# Patient Record
Sex: Female | Born: 1935 | Race: White | Hispanic: No | Smoking: Former smoker
Health system: Southern US, Community
[De-identification: ages and names within clinical notes are randomized; demographics above are authoritative.]

## PROBLEM LIST (undated history)

## (undated) DIAGNOSIS — R011 Cardiac murmur, unspecified: Secondary | ICD-10-CM

## (undated) DIAGNOSIS — E119 Type 2 diabetes mellitus without complications: Secondary | ICD-10-CM

## (undated) DIAGNOSIS — I10 Essential (primary) hypertension: Secondary | ICD-10-CM

## (undated) DIAGNOSIS — E785 Hyperlipidemia, unspecified: Secondary | ICD-10-CM

---

## 1998-12-05 LAB — HM PAP SMEAR: HM Pap smear: NORMAL

## 2000-09-17 HISTORY — PX: ABDOMINAL HYSTERECTOMY: SHX81

## 2003-09-18 HISTORY — PX: HERNIA REPAIR: SHX51

## 2004-11-03 ENCOUNTER — Ambulatory Visit: Payer: Self-pay | Admitting: Family Medicine

## 2005-11-26 ENCOUNTER — Ambulatory Visit: Payer: Self-pay | Admitting: Unknown Physician Specialty

## 2005-11-26 LAB — HM COLONOSCOPY

## 2006-04-04 ENCOUNTER — Ambulatory Visit: Payer: Self-pay | Admitting: Family Medicine

## 2007-03-28 ENCOUNTER — Ambulatory Visit: Payer: Self-pay | Admitting: Family Medicine

## 2007-04-17 ENCOUNTER — Ambulatory Visit: Payer: Self-pay | Admitting: Family Medicine

## 2008-01-07 LAB — HM DEXA SCAN

## 2008-04-22 ENCOUNTER — Ambulatory Visit: Payer: Self-pay | Admitting: Family Medicine

## 2009-04-27 ENCOUNTER — Ambulatory Visit: Payer: Self-pay | Admitting: Family Medicine

## 2009-11-07 ENCOUNTER — Ambulatory Visit: Payer: Self-pay | Admitting: Family Medicine

## 2010-02-09 ENCOUNTER — Emergency Department: Payer: Self-pay | Admitting: Emergency Medicine

## 2010-05-19 ENCOUNTER — Encounter: Payer: Self-pay | Admitting: Family Medicine

## 2010-05-24 ENCOUNTER — Ambulatory Visit: Payer: Self-pay | Admitting: Family Medicine

## 2010-07-06 ENCOUNTER — Ambulatory Visit: Payer: Self-pay | Admitting: Family Medicine

## 2011-01-01 ENCOUNTER — Observation Stay: Payer: Self-pay | Admitting: Internal Medicine

## 2011-09-17 ENCOUNTER — Ambulatory Visit: Payer: Self-pay | Admitting: Family Medicine

## 2011-11-13 ENCOUNTER — Encounter: Payer: Self-pay | Admitting: Family Medicine

## 2011-11-16 ENCOUNTER — Encounter: Payer: Self-pay | Admitting: Family Medicine

## 2011-12-17 ENCOUNTER — Encounter: Payer: Self-pay | Admitting: Family Medicine

## 2012-01-16 ENCOUNTER — Encounter: Payer: Self-pay | Admitting: Family Medicine

## 2012-05-13 ENCOUNTER — Inpatient Hospital Stay: Payer: Self-pay | Admitting: Internal Medicine

## 2012-05-13 LAB — URINALYSIS, COMPLETE
Bilirubin,UR: NEGATIVE
Blood: NEGATIVE
Ketone: NEGATIVE
Ph: 6 (ref 4.5–8.0)
Protein: NEGATIVE
Specific Gravity: 1.011 (ref 1.003–1.030)
Squamous Epithelial: <1

## 2012-05-13 LAB — CBC
HGB: 11.2 g/dL — ABNORMAL LOW (ref 12.0–16.0)
MCH: 31.7 pg (ref 26.0–34.0)
MCHC: 33.1 g/dL (ref 32.0–36.0)
RDW: 13.4 % (ref 11.5–14.5)
WBC: 7.1 10*3/uL (ref 3.6–11.0)

## 2012-05-13 LAB — BASIC METABOLIC PANEL
Anion Gap: 3 — ABNORMAL LOW (ref 7–16)
BUN: 30 mg/dL — ABNORMAL HIGH (ref 7–18)
Creatinine: 1.05 mg/dL (ref 0.60–1.30)
EGFR (African American): 59 — ABNORMAL LOW
EGFR (Non-African Amer.): 51 — ABNORMAL LOW
Glucose: 117 mg/dL — ABNORMAL HIGH (ref 65–99)
Sodium: 140 mmol/L (ref 136–145)

## 2012-05-14 LAB — LIPID PANEL
Cholesterol: 150 mg/dL (ref 0–200)
Ldl Cholesterol, Calc: 72 mg/dL (ref 0–100)
Triglycerides: 147 mg/dL (ref 0–200)
VLDL Cholesterol, Calc: 29 mg/dL (ref 5–40)

## 2012-12-18 ENCOUNTER — Ambulatory Visit: Payer: Self-pay | Admitting: Family Medicine

## 2013-01-19 ENCOUNTER — Ambulatory Visit: Payer: Self-pay | Admitting: Family Medicine

## 2013-01-22 ENCOUNTER — Ambulatory Visit: Payer: Self-pay | Admitting: Family Medicine

## 2014-01-08 ENCOUNTER — Ambulatory Visit: Payer: Self-pay | Admitting: Family Medicine

## 2014-01-09 LAB — HM MAMMOGRAPHY: HM Mammogram: NORMAL

## 2014-10-24 ENCOUNTER — Emergency Department: Payer: Self-pay | Admitting: Emergency Medicine

## 2014-10-28 ENCOUNTER — Ambulatory Visit: Payer: Self-pay | Admitting: Family Medicine

## 2014-11-24 LAB — LIPID PANEL
Cholesterol: 196 mg/dL (ref 0–200)
HDL: 71 mg/dL — AB (ref 35–70)
LDL Cholesterol: 90 mg/dL
LDl/HDL Ratio: 1.3
TRIGLYCERIDES: 176 mg/dL — AB (ref 40–160)

## 2014-11-24 LAB — BASIC METABOLIC PANEL
BUN: 25 mg/dL — AB (ref 4–21)
CREATININE: 1.4 mg/dL — AB (ref <=1.1)
Glucose: 186 mg/dL
Potassium: 5.2 mmol/L (ref 3.4–5.3)
SODIUM: 139 mmol/L (ref 137–147)

## 2014-11-24 LAB — HEPATIC FUNCTION PANEL
ALK PHOS: 83 U/L (ref 25–125)
ALT: 7 U/L (ref 7–35)
AST: 16 U/L (ref 13–35)
Bilirubin, Total: 0.3 mg/dL

## 2014-11-24 LAB — CBC AND DIFFERENTIAL
HCT: 35 % — AB (ref 36–46)
Hemoglobin: 11.5 g/dL — AB (ref 12.0–16.0)
NEUTROS ABS: 76 /uL
Platelets: 268 10*3/uL (ref 150–399)
WBC: 7.5 10^3/mL

## 2014-11-24 LAB — HEMOGLOBIN A1C: HEMOGLOBIN A1C: 8 % — AB (ref 4.0–6.0)

## 2014-11-24 LAB — TSH: TSH: 2.02 u[IU]/mL (ref <=5.90)

## 2015-01-04 NOTE — Discharge Summary (Signed)
PATIENT NAME:  [name], Linda H MR#:  [id number] DATE OF BIRTH:  [birth year]  DATE OF ADMISSION:  05/13/2012 DATE OF DISCHARGE:  05/14/2012  PRIMARY CARE PHYSICIAN:  Dr. Julieanne Mansonichard Gilbert     CHIEF COMPLAINT: Right lip and tongue swelling along with right facial numbness.   DISCHARGE DIAGNOSES:  1. Transient ischemic attack.  2. Hypertension.  3. History of cerebrovascular accident.  4. Type 2 diabetes.  5. Mild internal carotid artery stenosis of 50% bilaterally.  CONDITION ON DISCHARGE: Fair. Vitals stable.   REFERRALS: Home Health physical therapy   MEDICATIONS:  1. Aspirin 81 mg daily.  2. Plavix 75 mg daily.  3. Calcium with vitamin D 1 tablet daily.  4. Glipizide XL 10 mg extended-release once a day.  5. Simvastatin 80 mg daily.  6. Multivitamin p.o. daily.  7. Hydrochlorothiazide/quinapril 25/20, 1 tablet daily.  8. Metformin 1000 mg b.i.d.  9. Trazodone 100 mg at bedtime.  10. Ferrous sulfate 325 mg p.o. daily.   DIET: Low sodium, ADA 1800-calorie diet.   FOLLOWUP: Follow up with Dr. Sullivan LoneGilbert on 05/21/2012 at 9:30 a.m.   LABORATORY DATA: Cholesterol is 150. Triglycerides 147, LDL 72.  Ultrasound carotid Doppler suggestive of borderline stenosis of 50% in the internal carotid arteries bilaterally. Echo Doppler: Ejection fraction is 50%,. Left atrium is mildly dilated. There is mild to moderate mitral regurgitation. There is mild tricuspid regurgitation. Mild valvular aortic stenosis. No apparent source? of cerebrovascular accident. Urinalysis negative for urinary tract infection. Glucose 117, BUN 30, creatinine 1.05, sodium 140, potassium 4.6, chloride 104, bicarbonate 33. Hemoglobin and hematocrit 11.2 and 33.8. White count 7.1, platelet count 297. CT of the head without contrast shows mild atrophy.   BRIEF SUMMARY OF HOSPITAL COURSE: Linda Jones is a very pleasant 81 year old Caucasian female with history of cerebrovascular accident, type 2 diabetes, and hypertension in the  past who came in with:  1. Numbness on the right side of the face along with mild expressive aphasia, tingling of the lips and tongue, which improved over the ER stay.  CT of the head was negative for acute infarction. She was continued on aspirin and Plavix. Carotid ultrasound showed bilateral 50% mild stenosis. She will see vascular as an outpatient. We will defer to Dr. Sullivan LoneGilbert to make those arrangements.   2. Physical therapy saw the patient and recommended Home Health PT which has been arranged.  3. Hypertension. Continue home medication which is hydrochlorothiazide/quinapril.  4. Type 2 diabetes. Sliding scale insulin and metformin b.i.d. along with glipizide.  5. Chronic anemia. Iron supplements were continued.  6. Hyperlipidemia. The patient is on simvastatin. Lipid profile within normal limits.  7. Hospital stay otherwise remained stable.  8. CODE STATUS: The patient remained a FULL CODE.   TIME SPENT: 40 minutes.  ____________________________ Wylie HailSona A. Allena KatzPatel, MD sap:bjt D: 05/15/2012 07:05:41 ET T: 05/15/2012 13:19:43 ET JOB#: [id number]  cc: Nikyah Lackman A. Allena KatzPatel, MD, <Dictator> Richard L. Sullivan LoneGilbert, MD Willow OraSONA A Hortencia Martire MD ELECTRONICALLY SIGNED 05/20/2012 15:44

## 2015-01-04 NOTE — H&P (Signed)
PATIENT NAME:  Linda Jones, Linda Jones MR#:  [id number] DATE OF BIRTH:  28-Feb-1932  DATE OF ADMISSION:  05/13/2012  PRIMARY CARE PHYSICIAN: Richard L. Sullivan Lone, MD   CHIEF COMPLAINT: Numbness over the lips, tongue and right side of the face.   HISTORY OF PRESENT ILLNESS: Linda Jones is a pleasant 81 year old Caucasian female with history of stroke about two years ago, chronic anemia, hypercholesterolemia, diabetes and hypertension, comes to the Emergency Room after around 9:00 she started noticing numbness and tingling and "funny feeling" on her tongue and lips. It increased, and she started having numbness around her right face and minimal weakness in her right upper extremity. Given her history of stroke in the past, she has decided to come to the Emergency Room. She is hemodynamically stable. The patient did have some slurred speech with expressive aphasia which has cleared. Her symptoms, however, have improved since she came to the Emergency Room. The patient's CT of the head remains negative for any acute cerebrovascular accident. She is being admitted for further evaluation and management.   PAST MEDICAL/SURGICAL HISTORY:  1. Cerebrovascular accident two years ago without any neurologic deficit.  2. Chronic anemia.  3. Hypercholesterolemia.  4. Hypertension.  5. Type 2 diabetes.  6. Total hysterectomy.  7. Hernia repair in the past.   MEDICATIONS:  1. Trazodone 100 mg at bedtime.  2. Simvastatin 80 mg daily.  3. Plavix 75 mg daily.  4. Multivitamin p.o. daily.  5. Metformin 1000 mg b.i.d.  6. Hydrochlorothiazide/quinapril 25/20, 1 tablet daily.  7. Glipizide XL 10 mg daily.  8. Ferrous sulfate 325 mg p.o. daily.  9. Calcium with vitamin D, 1 tablet p.o. daily.  10. Aspirin 81 mg daily.   FAMILY HISTORY: Positive for hypertension and type 2 diabetes.   REVIEW OF SYSTEMS: CONSTITUTIONAL: No fever or fatigue, weakness. EYES: No blurred or double vision or cataract. ENT: No tinnitus, ear  pain, hearing loss. RESPIRATORY: No cough, wheeze, hemoptysis, or chronic obstructive pulmonary disease. CARDIOVASCULAR: No chest pain, orthopnea, or edema. GASTROINTESTINAL: No nausea, vomiting, diarrhea, or abdominal pain. GU: No dysuria or hematuria. ENDOCRINE: No polyuria, nocturia, or thyroid problems. HEMATOLOGY: No anemia or easy bruising. SKIN: No acne or rash. MUSCULOSKELETAL: Positive for arthritis. NEUROLOGIC: Positive for some numbness on her right side of the face, and she earlier did have some expressive aphasia. PSYCHIATRIC: No anxiety or depression. All other systems are reviewed and negative.   PHYSICAL EXAMINATION:  GENERAL: The patient is awake, alert, oriented x3, not in acute distress.   VITAL SIGNS: She is afebrile. Pulse is 79, blood pressure is 118/65, saturations are 98% on 3 liters.   HEENT: Atraumatic, normocephalic. Pupils are equal, round, and reactive to light and accommodation. Extraocular movements are intact. Oral mucosa is moist.   NECK: Supple. No JVD. No carotid bruit.   LUNGS: Clear to auscultation bilaterally. No rales, rhonchi, respiratory distress, or labored breathing.   HEART: Both the heart sounds are normal. Rate, rhythm is regular. PMI is not lateralized. Chest is nontender.   EXTREMITIES: Good pedal pulses, good femoral pulses. No lower extremity edema.   ABDOMEN: Soft, benign, and nontender. No organomegaly. Positive bowel sounds.   NEUROLOGIC: The patient is right-handed. Speech is clear. There is no facial droop. Cranial nerves II through XII are grossly intact. Her motor system exam in both upper and lower extremities is 4+/5 strength. Deep tendon jerks are 1+, and plantars are downgoing. Gait was not tested.   LABORATORY, DIAGNOSTIC AND RADIOLOGICAL DATA:  EKG: Normal sinus rhythm. Ultrasound carotid Doppler findings are borderline for stenosis of 50% in the internal carotid arteries bilaterally. Urinalysis is negative for urinary tract  infection. Glucose is 117, BUN is 30, creatinine is 1.05, chloride is 104, bicarbonate is 33, calcium is 9.9. Hemoglobin and hematocrit  is 11.2 and 33.8. White count is 7.1. Troponin is 0.02. CT of the head: Mild atrophy, chronic small vessel ischemic disease. No acute intracranial abnormality.   ASSESSMENT: Linda Jones is a 81 year old with history of type 2 diabetes, hypertension, history of stroke in the past, comes in with:   1. Numbness on the right side of the face along with mild expressive aphasia, tingling numbness of the tongue and the lips which have improved over the ER stay consistent with transient ischemic attack: CT of the head is negative for acute infarct. We will admit the patient to the telemetry floor. A 2 gram, 1800 calorie diet. We will continue to monitor neurologically every 4 hours. We will continue aspirin and Plavix as before. MRI will be done only if symptoms worsen. Carotid Doppler ultrasound results are noted. We will refer the patient for outpatient vascular evaluation. We will consult Physical Therapy as well.  2. Hypertension: Continue home medications which are hydrochlorothiazide/quinapril.  3. Type 2 diabetes: Sliding scale insulin and metformin b.i.d. along with glipizide.  4. Chronic anemia: Continue iron supplements.  5. Hyperlipidemia: On simvastatin.  6. Deep vein thrombosis prophylaxis: The  patient is ambulatory, and TEDS.   The hospital admission plan was discussed with the patient and the patient's son, and further work-up will be according to the patient's clinical course.   TIME SPENT: 50 minutes. ____________________________ Wylie HailSona A. Allena KatzPatel, MD sap:cbb D: 05/13/2012 16:57:36 ET T: 05/13/2012 18:32:36 ET JOB#: [id number]  cc: Martie Muhlbauer A. Allena KatzPatel, MD, <Dictator> Richard L. Sullivan LoneGilbert, MD Willow OraSONA A Lynnie Koehler MD ELECTRONICALLY SIGNED 05/20/2012 15:44

## 2015-02-07 DIAGNOSIS — IMO0002 Reserved for concepts with insufficient information to code with codable children: Secondary | ICD-10-CM | POA: Insufficient documentation

## 2015-02-07 DIAGNOSIS — D649 Anemia, unspecified: Secondary | ICD-10-CM | POA: Insufficient documentation

## 2015-02-07 DIAGNOSIS — I6350 Cerebral infarction due to unspecified occlusion or stenosis of unspecified cerebral artery: Secondary | ICD-10-CM | POA: Insufficient documentation

## 2015-02-07 DIAGNOSIS — E538 Deficiency of other specified B group vitamins: Secondary | ICD-10-CM | POA: Insufficient documentation

## 2015-02-07 DIAGNOSIS — I6529 Occlusion and stenosis of unspecified carotid artery: Secondary | ICD-10-CM | POA: Insufficient documentation

## 2015-02-07 DIAGNOSIS — E782 Mixed hyperlipidemia: Secondary | ICD-10-CM | POA: Insufficient documentation

## 2015-02-07 DIAGNOSIS — E119 Type 2 diabetes mellitus without complications: Secondary | ICD-10-CM | POA: Insufficient documentation

## 2015-02-07 DIAGNOSIS — G939 Disorder of brain, unspecified: Secondary | ICD-10-CM | POA: Insufficient documentation

## 2015-02-07 DIAGNOSIS — I6782 Cerebral ischemia: Secondary | ICD-10-CM | POA: Insufficient documentation

## 2015-02-07 DIAGNOSIS — G2581 Restless legs syndrome: Secondary | ICD-10-CM | POA: Insufficient documentation

## 2015-02-07 DIAGNOSIS — R269 Unspecified abnormalities of gait and mobility: Secondary | ICD-10-CM | POA: Insufficient documentation

## 2015-02-07 DIAGNOSIS — I10 Essential (primary) hypertension: Secondary | ICD-10-CM | POA: Insufficient documentation

## 2015-03-23 ENCOUNTER — Other Ambulatory Visit: Payer: Self-pay | Admitting: Family Medicine

## 2015-04-08 ENCOUNTER — Other Ambulatory Visit: Payer: Self-pay

## 2015-04-08 MED ORDER — GLIPIZIDE 5 MG PO TABS
5.0000 mg | ORAL_TABLET | Freq: Every day | ORAL | Status: DC
Start: 2015-04-08 — End: 2015-09-27

## 2015-04-08 MED ORDER — SITAGLIPTIN PHOSPHATE 100 MG PO TABS: 100.0000 mg | ORAL_TABLET | Freq: Every day | ORAL | Status: DC

## 2015-04-08 NOTE — Telephone Encounter (Signed)
Please review, email from patient to be sent to [facility] pulled down already. Thank you-aa

## 2015-04-10 MED ORDER — TRAMADOL HCL 50 MG PO TABS: ORAL_TABLET | ORAL | Status: DC

## 2015-04-19 ENCOUNTER — Ambulatory Visit (INDEPENDENT_AMBULATORY_CARE_PROVIDER_SITE_OTHER): Payer: Medicare Other | Admitting: Family Medicine

## 2015-04-19 ENCOUNTER — Encounter: Payer: Self-pay | Admitting: Family Medicine

## 2015-04-19 VITALS — BP 128/60 | HR 68 | Temp 97.5°F | Resp 16 | Wt 173.0 lb

## 2015-04-19 DIAGNOSIS — E1140 Type 2 diabetes mellitus with diabetic neuropathy, unspecified: Secondary | ICD-10-CM

## 2015-04-19 DIAGNOSIS — I10 Essential (primary) hypertension: Secondary | ICD-10-CM | POA: Diagnosis not present

## 2015-04-19 DIAGNOSIS — I499 Cardiac arrhythmia, unspecified: Secondary | ICD-10-CM

## 2015-04-19 DIAGNOSIS — E668 Other obesity: Secondary | ICD-10-CM | POA: Diagnosis not present

## 2015-04-19 DIAGNOSIS — M79602 Pain in left arm: Secondary | ICD-10-CM | POA: Diagnosis not present

## 2015-04-19 DIAGNOSIS — R011 Cardiac murmur, unspecified: Secondary | ICD-10-CM | POA: Diagnosis not present

## 2015-04-19 DIAGNOSIS — IMO0002 Reserved for concepts with insufficient information to code with codable children: Secondary | ICD-10-CM

## 2015-04-19 LAB — POCT GLYCOSYLATED HEMOGLOBIN (HGB A1C): HEMOGLOBIN A1C: 7.3

## 2015-04-19 NOTE — Progress Notes (Signed)
Patient ID: Linda Jones, female   DOB: 1932/04/28, 81 y.o.   MRN: [id number]    Subjective:  HPI  Diabetes Mellitus Type II, Follow-up:   Lab Results  Component Value Date   HGBA1C 8.0* 11/24/2014   Last seen for diabetes 5 months ago.  Management changes included none. She reports good compliance with treatment. She is not having side effects.  Current symptoms include none. Home blood sugar records: 120-150  Episodes of hypoglycemia? no   Current Insulin Regimen: n/a Most Recent Eye Exam: almost a year ago  ------------------------------------------------------------------------   Hypertension, follow-up:  BP Readings from Last 3 Encounters:  04/19/15 128/60  11/24/14 142/76    She was last seen for hypertension 5 months ago.  BP at that visit was 142/76. Management changes since that visit include none .She reports good compliance with treatment. She is not having side effects.  She is not exercising. Outside blood pressures are not being checked. She is experiencing none.   ------------------------------------------------------------------------    Lipid/Cholesterol, Follow-up:   Last seen for this 5 months ago.  Management changes since that visit include none.  Last Lipid Panel:    Component Value Date/Time   CHOL 196 11/24/2014   CHOL 150 05/14/2012 0419   TRIG 176* 11/24/2014   TRIG 147 05/14/2012 0419   HDL 71* 11/24/2014   HDL 49 05/14/2012 0419   VLDL 29 05/14/2012 0419   LDLCALC 90 11/24/2014   LDLCALC 72 05/14/2012 0419    She reports good compliance with treatment. She is not having side effects.   Wt Readings from Last 3 Encounters:  04/19/15 173 lb (78.472 kg)  11/24/14 181 lb (82.101 kg)    ------------------------------------------------------------------------     Prior to Admission medications   Medication Sig Start Date End Date Taking? Authorizing Provider  aspirin 81 MG tablet Take by mouth.   Yes Historical  Provider, MD  calcium carbonate (OS-CAL) 600 MG TABS tablet Take by mouth.   Yes Historical Provider, MD  clopidogrel (PLAVIX) 75 MG tablet Take by mouth. 12/21/14  Yes Historical Provider, MD  cyanocobalamin (,VITAMIN B-12,) 1000 MCG/ML injection CYANOCOBALAMIN, 1000MCG/ML (Injection Solution)  1 Solution monthly for 0 days  Quantity: 10;  Refills: 0   Ordered :13-Sep-2014  Julieanne Manson MD;  Started 13-Sep-2014 Active Comments: # 12-1cc syringe with 25g 1 1/2" needles 09/13/14  Yes Historical Provider, MD  ferrous sulfate 325 (65 FE) MG tablet Take by mouth.   Yes Historical Provider, MD  glipiZIDE (GLUCOTROL) 5 MG tablet Take 1 tablet (5 mg total) by mouth daily. 04/08/15  Yes Richard Hulen Shouts., MD  glucose blood test strip TRUETEST TEST (In Vitro Strip)  1 Strip two times daily and as needed for 0 days  Quantity: 100;  Refills: 12   Ordered :04-Jun-2012  Janey Greaser ;  Started 04-Jun-2012 Active Comments: Medication taken as needed. 06/04/12  Yes Historical Provider, MD  hydrochlorothiazide (HYDRODIURIL) 25 MG tablet Take by mouth. 07/29/14  Yes Historical Provider, MD  hydrocortisone (ANUSOL-HC) 25 MG suppository Place rectally. 12/08/13  Yes Historical Provider, MD  lisinopril (PRINIVIL,ZESTRIL) 20 MG tablet Take by mouth. 12/21/14  Yes Historical Provider, MD  MULTIPLE VITAMIN PO Take by mouth.   Yes Historical Provider, MD  simvastatin (ZOCOR) 80 MG tablet TAKE 1/2 TABLET BY MOUTH DAILY( LABS NEEDED ON THE NEXT OFFICE VISIT) 03/23/15  Yes Maple Hudson., MD  sitaGLIPtin (JANUVIA) 100 MG tablet Take 1 tablet (100 mg total) by mouth daily.  04/08/15  Yes Richard Hulen Shouts., MD  traMADol Janean Sark) 50 MG tablet 1 to 2 tablets at bedtime 04/10/15  Yes Richard Hulen Shouts., MD  traZODone (DESYREL) 100 MG tablet Take by mouth. 12/31/14  Yes Historical Provider, MD    Patient Active Problem List   Diagnosis Date Noted  . Absolute anemia 02/07/2015  . Carotid artery narrowing 02/07/2015  .  Cerebral artery occlusion with cerebral infarction 02/07/2015  . Abnormality of gait 02/07/2015  . BP (high blood pressure) 02/07/2015  . Combined fat and carbohydrate induced hyperlipemia 02/07/2015  . Adult BMI 30+ 02/07/2015  . Restless leg 02/07/2015  . Temporary cerebral vascular dysfunction 02/07/2015  . Diabetes mellitus, type 2 02/07/2015  . B12 deficiency 02/07/2015    History reviewed. No pertinent past medical history.  History   Social History  . Marital Status: Widowed    Spouse Name: N/A  . Number of Children: N/A  . Years of Education: N/A   Occupational History  . Not on file.   Social History Main Topics  . Smoking status: Former Smoker -- 2 years  . Smokeless tobacco: Not on file  . Alcohol Use: No  . Drug Use: No  . Sexual Activity: Not on file   Other Topics Concern  . Not on file   Social History Narrative    Allergies  Allergen Reactions  . Erythromycin     Review of Systems  Constitutional: Negative.   HENT: Negative.   Eyes: Negative.   Respiratory: Negative.   Cardiovascular: Negative.   Gastrointestinal: Negative.   Genitourinary:       Incontinence.  Musculoskeletal: Positive for myalgias.  Skin: Negative.   Neurological: Positive for tremors (once in a while in her hands).  Endo/Heme/Allergies: Negative.   Psychiatric/Behavioral: Negative.     Immunization History  Administered Date(s) Administered  . Pneumococcal Conjugate-13 04/08/2014  . Pneumococcal Polysaccharide-23 08/01/2001, 05/26/2010  . Tdap 06/26/2011  . Zoster 07/10/2008   Objective:  BP 128/60 mmHg  Pulse 68  Temp(Src) 97.5 F (36.4 C) (Oral)  Resp 16  Wt 173 lb (78.472 kg)  Physical Exam  Constitutional: She is oriented to person, place, and time and well-developed, well-nourished, and in no distress.  HENT:  Head: Normocephalic and atraumatic.  Right Ear: External ear normal.  Left Ear: External ear normal.  Nose: Nose normal.  Eyes:  Conjunctivae and EOM are normal. Pupils are equal, round, and reactive to light.  Neck: Normal range of motion. Neck supple.  Cardiovascular: Normal rate, regular rhythm, normal heart sounds and intact distal pulses.   Irregular rhythm and systolic murmur at the right upper sternal border.  Pulmonary/Chest: Effort normal and breath sounds normal.  Musculoskeletal: Normal range of motion.  Post. Mid upper arm tender.  Neurological: She is alert and oriented to person, place, and time. She has normal reflexes. Gait normal. GCS score is 15.  Skin: Skin is warm and dry.  Psychiatric: Mood, memory, affect and judgment normal.    Lab Results  Component Value Date   WBC 7.5 11/24/2014   HGB 11.5* 11/24/2014   HCT 35* 11/24/2014   PLT 268 11/24/2014   GLUCOSE 117* 05/13/2012   CHOL 196 11/24/2014   TRIG 176* 11/24/2014   HDL 71* 11/24/2014   LDLCALC 90 11/24/2014   TSH 2.02 11/24/2014   HGBA1C 8.0* 11/24/2014    CMP     Component Value Date/Time   NA 139 11/24/2014   NA 140 05/13/2012 1105  K 5.2 11/24/2014   K 4.6 05/13/2012 1105   CL 104 05/13/2012 1105   CO2 33* 05/13/2012 1105   GLUCOSE 117* 05/13/2012 1105   BUN 25* 11/24/2014   BUN 30* 05/13/2012 1105   CREATININE 1.4* 11/24/2014   CREATININE 1.05 05/13/2012 1105   CALCIUM 9.9 05/13/2012 1105   AST 16 11/24/2014   ALT 7 11/24/2014   ALKPHOS 83 11/24/2014   GFRNONAA 51* 05/13/2012 1105   GFRAA 59* 05/13/2012 1105    Assessment and Plan :  1. Essential hypertension   2. Type 2 diabetes mellitus with diabetic neuropathy  - POCT HgB A1C--7.3 today  3. Adult BMI 30+   4. Left arm pain- will get x-rays if this does not get better or if it gets worse. She has a little tenderness in her mid posterior upper arm. I think this may be related to her fall earlier this year. A need referral or x-ray of that does not improve. No indication at all this could be cardiac.  5. Irregular heart rhythm- Due to PVC's  - EKG  12-Lead  6. Obesity Patient was seen and examined by Dr. Julieanne Manson, and noted scribed by Dimas Chyle, CMA   Julieanne Manson MD  [facility] 04/19/2015 8:42 AM

## 2015-06-11 ENCOUNTER — Ambulatory Visit (INDEPENDENT_AMBULATORY_CARE_PROVIDER_SITE_OTHER): Payer: Medicare Other

## 2015-06-11 DIAGNOSIS — Z23 Encounter for immunization: Secondary | ICD-10-CM

## 2015-07-27 ENCOUNTER — Encounter: Payer: Self-pay | Admitting: Emergency Medicine

## 2015-08-01 ENCOUNTER — Encounter: Payer: Self-pay | Admitting: Family Medicine

## 2015-08-22 ENCOUNTER — Ambulatory Visit (INDEPENDENT_AMBULATORY_CARE_PROVIDER_SITE_OTHER): Payer: Medicare Other | Admitting: Family Medicine

## 2015-08-22 VITALS — BP 162/74 | HR 92 | Temp 97.6°F | Resp 16 | Wt 172.0 lb

## 2015-08-22 DIAGNOSIS — E785 Hyperlipidemia, unspecified: Secondary | ICD-10-CM

## 2015-08-22 DIAGNOSIS — I10 Essential (primary) hypertension: Secondary | ICD-10-CM | POA: Diagnosis not present

## 2015-08-22 DIAGNOSIS — K219 Gastro-esophageal reflux disease without esophagitis: Secondary | ICD-10-CM

## 2015-08-22 DIAGNOSIS — E1140 Type 2 diabetes mellitus with diabetic neuropathy, unspecified: Secondary | ICD-10-CM

## 2015-08-22 DIAGNOSIS — D649 Anemia, unspecified: Secondary | ICD-10-CM | POA: Diagnosis not present

## 2015-08-22 DIAGNOSIS — R011 Cardiac murmur, unspecified: Secondary | ICD-10-CM

## 2015-08-22 DIAGNOSIS — R059 Cough, unspecified: Secondary | ICD-10-CM

## 2015-08-22 DIAGNOSIS — R05 Cough: Secondary | ICD-10-CM

## 2015-08-22 MED ORDER — RANITIDINE HCL 150 MG PO TABS: 150.0000 mg | ORAL_TABLET | Freq: Every day | ORAL | Status: DC

## 2015-08-22 MED ORDER — PIOGLITAZONE HCL 15 MG PO TABS: 15.0000 mg | ORAL_TABLET | Freq: Every day | ORAL | Status: DC

## 2015-08-22 NOTE — Progress Notes (Signed)
Patient ID: Linda Jones, female   DOB: 04/01/1932, 81 y.o.   MRN: [id number]   Linda BishopSandra H Ciszek  MRN: [id number] DOB: 09/28/1931  Subjective:  HPI   1. Essential hypertension The patient is an 81 year old female who presents for follow up of her hypertension.  She was last seen on 04/19/15 and no management changes were made.  She is currently taking Lisinopril and reports no side effects from the medicine.  She does not check her pressure outside of the office and states she has not taken her medicine today.  2. Type 2 diabetes mellitus with diabetic neuropathy, without long-term current use of insulin (HCC) The patient is also here to follow up on her diabetes.  She has been checking her glucose daily in the morning and has readings ranging from 120's-150.  She has not had any hypoglycemic events or symptoms.  Her last A1C was on 04/19/15 and it was 7.3.  The patient is requesting a generic medication in the place of her Januvia as she states it is very expensive for her.   The patient is also on Glipizide.  3. Hyperlipidemia The patient is also on Simvastatin and needs her level checked.     Patient Active Problem List   Diagnosis Date Noted  . Absolute anemia 02/07/2015  . Carotid artery narrowing 02/07/2015  . Cerebral artery occlusion with cerebral infarction (HCC) 02/07/2015  . Abnormality of gait 02/07/2015  . BP (high blood pressure) 02/07/2015  . Combined fat and carbohydrate induced hyperlipemia 02/07/2015  . Adult BMI 30+ 02/07/2015  . Restless leg 02/07/2015  . Temporary cerebral vascular dysfunction 02/07/2015  . Diabetes mellitus, type 2 (HCC) 02/07/2015  . B12 deficiency 02/07/2015    No past medical history on file.  Social History   Social History  . Marital Status: Widowed    Spouse Name: N/A  . Number of Children: N/A  . Years of Education: N/A   Occupational History  . Not on file.   Social History Main Topics  . Smoking status: Former Smoker -- 2  years  . Smokeless tobacco: Not on file  . Alcohol Use: No  . Drug Use: No  . Sexual Activity: Not on file   Other Topics Concern  . Not on file   Social History Narrative    Outpatient Prescriptions Prior to Visit  Medication Sig Dispense Refill  . aspirin 81 MG tablet Take by mouth.    . calcium carbonate (OS-CAL) 600 MG TABS tablet Take by mouth.    . clopidogrel (PLAVIX) 75 MG tablet Take by mouth.    . cyanocobalamin (,VITAMIN B-12,) 1000 MCG/ML injection CYANOCOBALAMIN, 1000MCG/ML (Injection Solution)  1 Solution monthly for 0 days  Quantity: 10;  Refills: 0   Ordered :13-Sep-2014  Julieanne MansonGilbert, Richard MD;  Started 13-Sep-2014 Active Comments: # 12-1cc syringe with 25g 1 1/2" needles    . ferrous sulfate 325 (65 FE) MG tablet Take by mouth.    Marland Kitchen. glipiZIDE (GLUCOTROL) 5 MG tablet Take 1 tablet (5 mg total) by mouth daily. 90 tablet 1  . glucose blood test strip TRUETEST TEST (In Vitro Strip)  1 Strip two times daily and as needed for 0 days  Quantity: 100;  Refills: 12   Ordered :04-Jun-2012  Janey GreaserDeSanto, Elena ;  Started 04-Jun-2012 Active Comments: Medication taken as needed.    . hydrochlorothiazide (HYDRODIURIL) 25 MG tablet Take by mouth.    . hydrocortisone (ANUSOL-HC) 25 MG suppository Place rectally.    .Marland Kitchen  lisinopril (PRINIVIL,ZESTRIL) 20 MG tablet Take by mouth.    . MULTIPLE VITAMIN PO Take by mouth.    . simvastatin (ZOCOR) 80 MG tablet TAKE 1/2 TABLET BY MOUTH DAILY( LABS NEEDED ON THE NEXT OFFICE VISIT) 90 tablet 0  . sitaGLIPtin (JANUVIA) 100 MG tablet Take 1 tablet (100 mg total) by mouth daily. 90 tablet 1  . traMADol (ULTRAM) 50 MG tablet 1 to 2 tablets at bedtime 60 tablet 5  . traZODone (DESYREL) 100 MG tablet Take by mouth.     No facility-administered medications prior to visit.    Allergies  Allergen Reactions  . Erythromycin     Review of Systems  Constitutional: Negative.   Respiratory: Positive for cough (Only at night when she lays down.  She feels it is  from some sinus drainage.). Negative for hemoptysis, sputum production, shortness of breath and wheezing.   Cardiovascular: Negative.   Gastrointestinal: Negative for heartburn, nausea, vomiting, abdominal pain, diarrhea and constipation.  Neurological: Negative for dizziness and headaches.  Endo/Heme/Allergies: Negative for polydipsia.   Objective:  BP 162/74 mmHg  Pulse 92  Temp(Src) 97.6 F (36.4 C) (Oral)  Resp 16  Wt 172 lb (78.019 kg)  Physical Exam  Constitutional: She is oriented to person, place, and time and well-developed, well-nourished, and in no distress.  HENT:  Head: Normocephalic and atraumatic.  Right Ear: External ear normal.  Left Ear: External ear normal.  Nose: Nose normal.  Eyes: Conjunctivae are normal. Pupils are equal, round, and reactive to light.  Neck: Normal range of motion. Neck supple.  Cardiovascular: Normal rate and regular rhythm.   Murmur heard. High pitched II/VI systolic murmur   Pulmonary/Chest: Effort normal and breath sounds normal.  Abdominal: Soft.  Musculoskeletal: She exhibits no edema.  Neurological: She is alert and oriented to person, place, and time.  Skin: Skin is warm and dry.  Psychiatric: Mood, memory, affect and judgment normal.    Assessment and Plan :   1. Essential hypertension  - CBC With Differential/Platelet - COMPLETE METABOLIC PANEL WITH GFR - TSH  2. Type 2 diabetes mellitus with diabetic neuropathy, without long-term current use of insulin (HCC) Due to cost we will have the patient stop her Januvia and start her on Actos. - pioglitazone (ACTOS) 15 MG tablet; Take 1 tablet (15 mg total) by mouth daily.  Dispense: 30 tablet; Refill: 12  - Hemoglobin A1c  3. Hyperlipidemia  - Lipid Panel With LDL/HDL Ratio  4. Cough Probably from acid reflux, will try the patient on Zantac at night and see if she gets relief.  - ranitidine (ZANTAC) 150 MG tablet; Take 1 tablet (150 mg total) by mouth at bedtime.   Dispense: 30 tablet; Refill: 12  5. Anemia, unspecified anemia type   6. Gastroesophageal reflux disease, esophagitis presence not specified   7. Murmur, heart This is a new finding.  Patient has seen Dr. Lady Gary for a stress test in the past.  Will send for echocardiogram and have Dr. Lady Gary to read. Consider cardiology referral but presently patient is completely asymptomatic.  - Echocardiogram; Future   I have done the exam and reviewed the above chart and it is accurate to the best of my knowledge.  Julieanne Manson MD [facility] 08/22/2015 8:27 AM

## 2015-08-24 ENCOUNTER — Telehealth: Payer: Self-pay | Admitting: Family Medicine

## 2015-08-24 NOTE — Telephone Encounter (Signed)
lmtcb for a representative.-aa

## 2015-08-24 NOTE — Telephone Encounter (Signed)
[facility] needs a clarification on Actose 15mg  . Black Box Warning   Call back is [phone]  Thanks Barth Kirkseri

## 2015-08-26 ENCOUNTER — Ambulatory Visit
Admission: RE | Admit: 2015-08-26 | Discharge: 2015-08-26 | Disposition: A | Discharge status: Home / Self Care | Payer: Medicare Other | Source: Ambulatory Visit | Attending: Family Medicine | Admitting: Family Medicine

## 2015-08-26 DIAGNOSIS — I071 Rheumatic tricuspid insufficiency: Secondary | ICD-10-CM | POA: Diagnosis not present

## 2015-08-26 DIAGNOSIS — I350 Nonrheumatic aortic (valve) stenosis: Secondary | ICD-10-CM | POA: Insufficient documentation

## 2015-08-26 DIAGNOSIS — R011 Cardiac murmur, unspecified: Secondary | ICD-10-CM | POA: Insufficient documentation

## 2015-08-26 DIAGNOSIS — I340 Nonrheumatic mitral (valve) insufficiency: Secondary | ICD-10-CM | POA: Insufficient documentation

## 2015-08-26 DIAGNOSIS — I351 Nonrheumatic aortic (valve) insufficiency: Secondary | ICD-10-CM | POA: Insufficient documentation

## 2015-08-26 DIAGNOSIS — I5189 Other ill-defined heart diseases: Secondary | ICD-10-CM | POA: Diagnosis not present

## 2015-08-26 LAB — CBC WITH DIFFERENTIAL/PLATELET

## 2015-08-26 LAB — COMPREHENSIVE METABOLIC PANEL
A/G RATIO: 1.6 (ref 1.1–2.5)
ALBUMIN: 4 g/dL (ref 3.5–4.7)
ALK PHOS: 79 IU/L (ref 39–117)
ALT: 7 IU/L (ref 0–32)
AST: 14 IU/L (ref 0–40)
BILIRUBIN TOTAL: 0.3 mg/dL (ref 0.0–1.2)
BUN / CREAT RATIO: 26 (ref 11–26)
BUN: 36 mg/dL — AB (ref 8–27)
CALCIUM: 9.7 mg/dL (ref 8.7–10.3)
CHLORIDE: 98 mmol/L (ref 97–106)
CO2: 25 mmol/L (ref 18–29)
Creatinine, Ser: 1.38 mg/dL — ABNORMAL HIGH (ref 0.57–1.00)
GFR calc non Af Amer: 36 mL/min/{1.73_m2} — ABNORMAL LOW (ref >59)
GFR, EST AFRICAN AMERICAN: 41 mL/min/{1.73_m2} — AB (ref >59)
GLUCOSE: 205 mg/dL — AB (ref 65–99)
Globulin, Total: 2.5 g/dL (ref 1.5–4.5)
POTASSIUM: 4.9 mmol/L (ref 3.5–5.2)
Sodium: 139 mmol/L (ref 136–144)
TOTAL PROTEIN: 6.5 g/dL (ref 6.0–8.5)

## 2015-08-26 LAB — LIPID PANEL WITH LDL/HDL RATIO
Cholesterol, Total: 198 mg/dL (ref 100–199)
HDL: 66 mg/dL (ref >39)
LDL Calculated: 100 mg/dL — ABNORMAL HIGH (ref 0–99)
LDL/HDL RATIO: 1.5 ratio (ref 0.0–3.2)
Triglycerides: 160 mg/dL — ABNORMAL HIGH (ref 0–149)
VLDL CHOLESTEROL CAL: 32 mg/dL (ref 5–40)

## 2015-08-26 LAB — TSH: TSH: 2.69 u[IU]/mL (ref 0.450–4.500)

## 2015-08-26 LAB — HEMOGLOBIN A1C
ESTIMATED AVERAGE GLUCOSE: 183 mg/dL
Hgb A1c MFr Bld: 8 % — ABNORMAL HIGH (ref 4.8–5.6)

## 2015-08-26 NOTE — Progress Notes (Signed)
*  PRELIMINARY RESULTS* Echocardiogram 2D Echocardiogram has been performed.  Georgann HousekeeperJerry R Hege 08/26/2015, 11:41 AM

## 2015-08-29 ENCOUNTER — Telehealth: Payer: Self-pay

## 2015-08-29 NOTE — Telephone Encounter (Signed)
Advised patient of results. Patient is also requesting lab results. Please review. Thanks!

## 2015-08-29 NOTE — Telephone Encounter (Signed)
-----   Message from Maple Hudsonichard L Gilbert Jr., MD sent at 08/27/2015  9:35 AM EST ----- Please put in as new diagnosis mild mitral and tricuspid regurgitation. Let patient know she has mild valvular heart disease. Nothing that needs to be done other than taking antibiotics prior to any dental procedures, including normal  cleaning

## 2015-08-29 NOTE — Telephone Encounter (Signed)
Left message to call back  

## 2015-08-30 ENCOUNTER — Telehealth: Payer: Self-pay

## 2015-08-30 DIAGNOSIS — I081 Rheumatic disorders of both mitral and tricuspid valves: Secondary | ICD-10-CM | POA: Insufficient documentation

## 2015-08-30 NOTE — Telephone Encounter (Signed)
Advised  ED 

## 2015-08-30 NOTE — Telephone Encounter (Signed)
-----   Message from Maple Hudsonichard L Gilbert Jr., MD sent at 08/30/2015  9:50 AM EST ----- Labs stable

## 2015-08-30 NOTE — Telephone Encounter (Signed)
LMTCB ED 

## 2015-09-21 ENCOUNTER — Other Ambulatory Visit: Payer: Self-pay | Admitting: Family Medicine

## 2015-09-21 MED ORDER — GLUCOSE BLOOD VI STRP: ORAL_STRIP | Status: AC

## 2015-09-21 MED ORDER — ONETOUCH DELICA LANCETS 33G MISC: Status: DC

## 2015-09-21 NOTE — Telephone Encounter (Signed)
Pt contacted office for refill request on the following medications:  One Touch Ultra 2 glucose blood test strip.  [facility] [address]  [phone]#[phone]/MW

## 2015-09-21 NOTE — Telephone Encounter (Signed)
Done-aa 

## 2015-09-27 ENCOUNTER — Other Ambulatory Visit: Payer: Self-pay | Admitting: Emergency Medicine

## 2015-09-27 ENCOUNTER — Other Ambulatory Visit: Payer: Self-pay | Admitting: Family Medicine

## 2015-09-27 DIAGNOSIS — E1140 Type 2 diabetes mellitus with diabetic neuropathy, unspecified: Secondary | ICD-10-CM

## 2015-09-27 MED ORDER — LISINOPRIL 20 MG PO TABS: 20.0000 mg | ORAL_TABLET | Freq: Every day | ORAL | Status: DC

## 2015-09-27 MED ORDER — GLIPIZIDE 5 MG PO TABS: 5.0000 mg | ORAL_TABLET | Freq: Every day | ORAL | Status: AC

## 2015-09-27 MED ORDER — CYANOCOBALAMIN 1000 MCG/ML IJ SOLN: 1000.0000 ug | INTRAMUSCULAR | Status: DC

## 2015-09-27 MED ORDER — CLOPIDOGREL BISULFATE 75 MG PO TABS: 75.0000 mg | ORAL_TABLET | Freq: Every day | ORAL | Status: AC

## 2015-09-27 MED ORDER — SIMVASTATIN 80 MG PO TABS: 80.0000 mg | ORAL_TABLET | Freq: Every day | ORAL | Status: DC

## 2015-09-27 MED ORDER — GLIPIZIDE 5 MG PO TABS: 5.0000 mg | ORAL_TABLET | Freq: Every day | ORAL | Status: DC

## 2015-09-27 MED ORDER — HYDROCHLOROTHIAZIDE 25 MG PO TABS: 25.0000 mg | ORAL_TABLET | Freq: Every day | ORAL | Status: DC

## 2015-09-27 MED ORDER — TRAZODONE HCL 100 MG PO TABS: 100.0000 mg | ORAL_TABLET | Freq: Every day | ORAL | Status: DC

## 2015-09-27 MED ORDER — TRAZODONE HCL 100 MG PO TABS: 100.0000 mg | ORAL_TABLET | Freq: Every day | ORAL | Status: AC

## 2015-09-27 MED ORDER — SIMVASTATIN 40 MG PO TABS: 40.0000 mg | ORAL_TABLET | Freq: Every day | ORAL | Status: AC

## 2015-09-27 MED ORDER — PIOGLITAZONE HCL 15 MG PO TABS: 15.0000 mg | ORAL_TABLET | Freq: Every day | ORAL | Status: DC

## 2015-09-27 MED ORDER — TRAMADOL HCL 50 MG PO TABS: ORAL_TABLET | ORAL | Status: AC

## 2015-09-27 MED ORDER — LISINOPRIL 20 MG PO TABS: 20.0000 mg | ORAL_TABLET | Freq: Every day | ORAL | Status: AC

## 2015-09-27 NOTE — Telephone Encounter (Signed)
Done-aa 

## 2015-09-27 NOTE — Telephone Encounter (Addendum)
Pt contacted office for refill request on the following medications:  90 day supply.  [facility] mail order.  CB#[phone]/MW  hydrochlorothiazide (HYDRODIURIL) 25 MG tablet  simvastatin (ZOCOR) 80 MG tablet  pioglitazone (ACTOS) 15 MG tablet  cyanocobalamin (,VITAMIN B-12,) 1000 MCG/ML injection  glipiZIDE (GLUCOTROL) 5 MG tablet  lisinopril (PRINIVIL,ZESTRIL) 20 MG tablet  traMADol (ULTRAM) 50 MG tablet  traZODone (DESYREL) 100 MG tablet

## 2015-10-05 ENCOUNTER — Telehealth: Payer: Self-pay | Admitting: Family Medicine

## 2015-10-05 NOTE — Telephone Encounter (Signed)
Pt states that a prescription for simvastatin has been sent into [facility] for 40 MG and also for 80 MG.She needs our office to contact them at [phone] to let them know that she is on the 40 MG before they will fill medication

## 2015-10-06 NOTE — Telephone Encounter (Signed)
Filled this out on form and faxed to mail order-aa

## 2015-10-10 ENCOUNTER — Ambulatory Visit
Admission: RE | Admit: 2015-10-10 | Disposition: A | Discharge status: Home / Self Care | Payer: Medicare Other | Source: Ambulatory Visit | Attending: Family Medicine | Admitting: Family Medicine

## 2015-10-10 ENCOUNTER — Ambulatory Visit (INDEPENDENT_AMBULATORY_CARE_PROVIDER_SITE_OTHER): Payer: Medicare Other | Admitting: Family Medicine

## 2015-10-10 VITALS — BP 102/60 | HR 96 | Temp 98.5°F | Resp 20 | Wt 174.0 lb

## 2015-10-10 DIAGNOSIS — R0602 Shortness of breath: Secondary | ICD-10-CM | POA: Diagnosis not present

## 2015-10-10 DIAGNOSIS — I10 Essential (primary) hypertension: Secondary | ICD-10-CM

## 2015-10-10 DIAGNOSIS — R531 Weakness: Secondary | ICD-10-CM

## 2015-10-10 DIAGNOSIS — K219 Gastro-esophageal reflux disease without esophagitis: Secondary | ICD-10-CM | POA: Diagnosis not present

## 2015-10-10 DIAGNOSIS — E1140 Type 2 diabetes mellitus with diabetic neuropathy, unspecified: Secondary | ICD-10-CM

## 2015-10-10 DIAGNOSIS — R05 Cough: Secondary | ICD-10-CM | POA: Diagnosis not present

## 2015-10-10 DIAGNOSIS — I5033 Acute on chronic diastolic (congestive) heart failure: Secondary | ICD-10-CM

## 2015-10-10 DIAGNOSIS — E785 Hyperlipidemia, unspecified: Secondary | ICD-10-CM

## 2015-10-10 DIAGNOSIS — R059 Cough, unspecified: Secondary | ICD-10-CM

## 2015-10-10 MED ORDER — FUROSEMIDE 20 MG PO TABS: 20.0000 mg | ORAL_TABLET | Freq: Every day | ORAL | Status: DC

## 2015-10-10 MED ORDER — SITAGLIPTIN PHOSPHATE 100 MG PO TABS: 100.0000 mg | ORAL_TABLET | Freq: Every day | ORAL | Status: AC

## 2015-10-10 NOTE — Progress Notes (Addendum)
Patient ID: Linda Jones, female   DOB: 01/20/1932, 82 y.o.   MRN: [id number]    Subjective:  HPI  Patient is here to discuss fatigue and SOB. Patient and her son today state that suddenly patient has felt more exhausted a little over a week ago. She gets SOB even with walking from one room to the next. Son states the other day that she had to get her son to help her to get to check out line and to the car, she almost seemed to have a syncope episode at that time. No chest pain or tightness more of that SOB. She is still having a non productive cough that we treated with Zantca 1 month ago. Patient has not notice any improvement on this.  Also 1 month ago due to the cost we switched her januvia to Actos but due to having some feet swelling she switched back to [place] states we advised her to do that if swelling became a problem. She made the switch back about a little over 1 week ago. Her sugars have been over 200. She does not know how the numbers were when she on Actos because her meter was broken. DOE and mild orthopnea noted. No Chest pain or tightness noted. Prior to Admission medications   Medication Sig Start Date End Date Taking? Authorizing Provider  aspirin 81 MG tablet Take by mouth.   Yes Historical Provider, MD  calcium carbonate (OS-CAL) 600 MG TABS tablet Take by mouth.   Yes Historical Provider, MD  clopidogrel (PLAVIX) 75 MG tablet Take 1 tablet (75 mg total) by mouth daily. 09/27/15  Yes Richard Hulen Shouts., MD  cyanocobalamin (,VITAMIN B-12,) 1000 MCG/ML injection Inject 1 mL (1,000 mcg total) into the muscle every 30 (thirty) days. 09/27/15  Yes Richard Hulen Shouts., MD  ferrous sulfate 325 (65 FE) MG tablet Take by mouth.   Yes Historical Provider, MD  glipiZIDE (GLUCOTROL) 5 MG tablet Take 1 tablet (5 mg total) by mouth daily. 09/27/15  Yes Richard Hulen Shouts., MD  glucose blood test strip TRUETEST TEST (In Vitro Strip)  1 Strip two times daily and as needed for  0 days  Quantity: 100;  Refills: 12   Ordered :04-Jun-2012  Janey Greaser ;  Started 04-Jun-2012 Active Comments: Medication taken as needed. 06/04/12  Yes Historical Provider, MD  glucose blood test strip Check sugar once daily. DX:E11.9 09/21/15  Yes Richard Hulen Shouts., MD  hydrochlorothiazide (HYDRODIURIL) 25 MG tablet Take 1 tablet (25 mg total) by mouth daily. 09/27/15  Yes Richard Hulen Shouts., MD  lisinopril (PRINIVIL,ZESTRIL) 20 MG tablet Take 1 tablet (20 mg total) by mouth daily. 09/27/15  Yes Richard Hulen Shouts., MD  MULTIPLE VITAMIN PO Take by mouth.   Yes Historical Provider, MD  [facility] DELICA LANCETS 33G MISC Check sugar once daily. DX: E11.9 09/21/15  Yes Richard Hulen Shouts., MD  ranitidine (ZANTAC) 150 MG tablet Take 1 tablet (150 mg total) by mouth at bedtime. 08/22/15  Yes Richard Hulen Shouts., MD  simvastatin (ZOCOR) 40 MG tablet Take 1 tablet (40 mg total) by mouth daily. 09/27/15  Yes Richard Hulen Shouts., MD  sitaGLIPtin (JANUVIA) 100 MG tablet Take 1 tablet (100 mg total) by mouth daily. 04/08/15  Yes Richard Hulen Shouts., MD  traMADol Janean Sark) 50 MG tablet 1 to 2 tablets at bedtime 09/27/15  Yes Richard Hulen Shouts., MD  traZODone (DESYREL) 100 MG tablet Take 1  tablet (100 mg total) by mouth at bedtime. 09/27/15  Yes Richard Hulen Shouts., MD    Patient Active Problem List   Diagnosis Date Noted  . Disorders of both mitral and tricuspid valves 08/30/2015  . Absolute anemia 02/07/2015  . Carotid artery narrowing 02/07/2015  . Cerebral artery occlusion with cerebral infarction (HCC) 02/07/2015  . Abnormality of gait 02/07/2015  . BP (high blood pressure) 02/07/2015  . Combined fat and carbohydrate induced hyperlipemia 02/07/2015  . Adult BMI 30+ 02/07/2015  . Restless leg 02/07/2015  . Temporary cerebral vascular dysfunction 02/07/2015  . Diabetes mellitus, type 2 (HCC) 02/07/2015  . B12 deficiency 02/07/2015    No past medical history on file.  Social History     Social History  . Marital Status: Widowed    Spouse Name: N/A  . Number of Children: N/A  . Years of Education: N/A   Occupational History  . Not on file.   Social History Main Topics  . Smoking status: Former Smoker -- 2 years  . Smokeless tobacco: Not on file  . Alcohol Use: No  . Drug Use: No  . Sexual Activity: Not on file   Other Topics Concern  . Not on file   Social History Narrative    Allergies  Allergen Reactions  . Erythromycin     Review of Systems  Constitutional: Positive for malaise/fatigue. Negative for fever and chills.  HENT: Negative.   Eyes: Negative.   Respiratory: Positive for cough. Negative for shortness of breath and wheezing.   Cardiovascular: Negative.   Gastrointestinal: Negative.   Musculoskeletal: Positive for myalgias and joint pain.  Neurological: Positive for weakness. Negative for dizziness and tingling.  Endo/Heme/Allergies: Negative.   Psychiatric/Behavioral: Negative.     Immunization History  Administered Date(s) Administered  . Influenza, High Dose Seasonal PF 06/11/2015  . Pneumococcal Conjugate-13 04/08/2014  . Pneumococcal Polysaccharide-23 08/01/2001, 05/26/2010  . Tdap 06/26/2011  . Zoster 07/10/2008   Objective:  BP 102/60 mmHg  Pulse 96  Temp(Src) 98.5 F (36.9 C)  Resp 20  Wt 174 lb (78.926 kg)  SpO2 97%  Physical Exam  Constitutional: She is oriented to person, place, and time and well-developed, well-nourished, and in no distress.  Obese white female in no acute distress.  HENT:  Head: Normocephalic and atraumatic.  Right Ear: External ear normal.  Left Ear: External ear normal.  Nose: Nose normal.  Eyes: Conjunctivae are normal. Pupils are equal, round, and reactive to light.  Neck: Normal range of motion. Neck supple.  Cardiovascular: Normal rate, regular rhythm, normal heart sounds and intact distal pulses.   No murmur heard. Pulmonary/Chest: Effort normal and breath sounds normal. No  respiratory distress. She has no wheezes.  Abdominal: Soft.  Musculoskeletal: She exhibits no edema or tenderness.  Neurological: She is alert and oriented to person, place, and time. Gait abnormal. Coordination normal.  Skin: Skin is warm and dry.  Psychiatric: Mood, memory, affect and judgment normal.    Lab Results  Component Value Date   WBC CANCELED 08/22/2015   HGB 11.5* 11/24/2014   HCT CANCELED 08/22/2015   PLT CANCELED 08/22/2015   GLUCOSE 205* 08/22/2015   CHOL 198 08/22/2015   TRIG 160* 08/22/2015   HDL 66 08/22/2015   LDLCALC 100* 08/22/2015   TSH 2.690 08/22/2015   HGBA1C 8.0* 08/22/2015    CMP     Component Value Date/Time   NA 139 08/22/2015 0905   NA 140 05/13/2012 1105   K 4.9  08/22/2015 0905   K 4.6 05/13/2012 1105   CL 98 08/22/2015 0905   CL 104 05/13/2012 1105   CO2 25 08/22/2015 0905   CO2 33* 05/13/2012 1105   GLUCOSE 205* 08/22/2015 0905   GLUCOSE 117* 05/13/2012 1105   BUN 36* 08/22/2015 0905   BUN 30* 05/13/2012 1105   CREATININE 1.38* 08/22/2015 0905   CREATININE 1.4* 11/24/2014   CREATININE 1.05 05/13/2012 1105   CALCIUM 9.7 08/22/2015 0905   CALCIUM 9.9 05/13/2012 1105   PROT 6.5 08/22/2015 0905   ALBUMIN 4.0 08/22/2015 0905   AST 14 08/22/2015 0905   ALT 7 08/22/2015 0905   ALKPHOS 79 08/22/2015 0905   BILITOT 0.3 08/22/2015 0905   GFRNONAA 36* 08/22/2015 0905   GFRNONAA 51* 05/13/2012 1105   GFRAA 41* 08/22/2015 0905   GFRAA 59* 05/13/2012 1105    Assessment and Plan :  1. Shortness of breath Workup for CHF.also refer to cardiology due to nonspecific EKG changes. EKG shows some changes. Will refer to cardiologist. Will add Lasix 20 mg until patient sees cardiologist.  I presume this is cardiac. Will get chest xray and labs. - EKG 12-Lead - B Nat Peptide - DG Chest 2 View; Future - Troponin I - Ambulatory referral to Cardiology - furosemide (LASIX) 20 MG tablet; Take 1 tablet (20 mg total) by mouth daily.  Dispense: 30  tablet; Refill: 3  2. Cough Not improving. Probably from heart failure, pending results. Zantac was not effective-stop it now. - B Nat Peptide - DG Chest 2 View; Future  3. Type 2 diabetes mellitus with diabetic neuropathy, without long-term current use of insulin (HCC) Not improved.  4. Hyperlipidemia - TSH  5. Essential hypertension Stable.  6. Weakness - B Nat Peptide - CBC with Differential/Platelet - Comprehensive metabolic panel - TSH - Troponin I - Ambulatory referral to Cardiology - furosemide (LASIX) 20 MG tablet; Take 1 tablet (20 mg total) by mouth daily.  Dispense: 30 tablet; Refill: 3 7. Obesity. Patient was seen and examined by Dr. Bosie Clos and note was scribed by Samara Deist, RMA. 8.Aortic stenosis Missed on Echo done last month--likely etiology of symptoms.   Julieanne Manson MD [facility] 10/10/2015 3:45 PM

## 2015-10-11 ENCOUNTER — Telehealth: Payer: Self-pay

## 2015-10-11 MED ORDER — FUROSEMIDE 20 MG PO TABS: 20.0000 mg | ORAL_TABLET | Freq: Every day | ORAL | Status: AC

## 2015-10-11 NOTE — Telephone Encounter (Signed)
Patient called this morning and states that she could not get xray done yesterday because the order is not signed. Please sign it off so patient knows when to go-aa

## 2015-10-11 NOTE — Telephone Encounter (Signed)
done

## 2015-10-11 NOTE — Addendum Note (Signed)
Addended by: Julieanne Manson on: 10/11/2015 06:27 PM   Modules accepted: Orders

## 2015-10-11 NOTE — Telephone Encounter (Signed)
Pt advised.   Thanks,   -Ambrielle Kington  

## 2015-10-12 ENCOUNTER — Ambulatory Visit
Admission: RE | Admit: 2015-10-12 | Discharge: 2015-10-12 | Disposition: A | Discharge status: Home / Self Care | Payer: Medicare Other | Source: Ambulatory Visit | Attending: Family Medicine | Admitting: Family Medicine

## 2015-10-12 DIAGNOSIS — R05 Cough: Secondary | ICD-10-CM | POA: Insufficient documentation

## 2015-10-12 DIAGNOSIS — R0602 Shortness of breath: Secondary | ICD-10-CM

## 2015-10-12 DIAGNOSIS — I700 Atherosclerosis of aorta: Secondary | ICD-10-CM | POA: Diagnosis not present

## 2015-10-12 DIAGNOSIS — R918 Other nonspecific abnormal finding of lung field: Secondary | ICD-10-CM | POA: Insufficient documentation

## 2015-10-12 DIAGNOSIS — R059 Cough, unspecified: Secondary | ICD-10-CM

## 2015-10-12 LAB — CBC WITH DIFFERENTIAL/PLATELET
BASOS ABS: 0 10*3/uL (ref 0.0–0.2)
Basos: 0 %
EOS (ABSOLUTE): 0.1 10*3/uL (ref 0.0–0.4)
Eos: 1 %
Hematocrit: 34.1 % (ref 34.0–46.6)
Hemoglobin: 10.7 g/dL — ABNORMAL LOW (ref 11.1–15.9)
Immature Grans (Abs): 0.1 10*3/uL (ref 0.0–0.1)
Immature Granulocytes: 1 %
LYMPHS ABS: 1 10*3/uL (ref 0.7–3.1)
Lymphs: 12 %
MCH: 30.8 pg (ref 26.6–33.0)
MCHC: 31.4 g/dL — AB (ref 31.5–35.7)
MCV: 98 fL — ABNORMAL HIGH (ref 79–97)
MONOS ABS: 0.7 10*3/uL (ref 0.1–0.9)
Monocytes: 8 %
Neutrophils Absolute: 6.8 10*3/uL (ref 1.4–7.0)
Neutrophils: 78 %
Platelets: 394 10*3/uL — ABNORMAL HIGH (ref 150–379)
RBC: 3.47 x10E6/uL — AB (ref 3.77–5.28)
RDW: 13.6 % (ref 12.3–15.4)
WBC: 8.7 10*3/uL (ref 3.4–10.8)

## 2015-10-12 LAB — COMPREHENSIVE METABOLIC PANEL
ALK PHOS: 56 IU/L (ref 39–117)
ALT: 11 IU/L (ref 0–32)
AST: 16 IU/L (ref 0–40)
Albumin/Globulin Ratio: 1.3 (ref 1.1–2.5)
Albumin: 3.7 g/dL (ref 3.5–4.7)
BILIRUBIN TOTAL: 0.4 mg/dL (ref 0.0–1.2)
BUN / CREAT RATIO: 23 (ref 11–26)
BUN: 36 mg/dL — AB (ref 8–27)
CO2: 23 mmol/L (ref 18–29)
CREATININE: 1.59 mg/dL — AB (ref 0.57–1.00)
Calcium: 9.8 mg/dL (ref 8.7–10.3)
Chloride: 101 mmol/L (ref 96–106)
GFR calc Af Amer: 35 mL/min/{1.73_m2} — ABNORMAL LOW (ref >59)
GFR calc non Af Amer: 30 mL/min/{1.73_m2} — ABNORMAL LOW (ref >59)
GLOBULIN, TOTAL: 2.8 g/dL (ref 1.5–4.5)
GLUCOSE: 139 mg/dL — AB (ref 65–99)
POTASSIUM: 4.6 mmol/L (ref 3.5–5.2)
SODIUM: 143 mmol/L (ref 134–144)
Total Protein: 6.5 g/dL (ref 6.0–8.5)

## 2015-10-12 LAB — TROPONIN I: TROPONIN I: 0.17 ng/mL — AB (ref 0.00–0.04)

## 2015-10-12 LAB — BRAIN NATRIURETIC PEPTIDE: BNP: 2096.5 pg/mL — AB (ref 0.0–100.0)

## 2015-10-12 LAB — TSH: TSH: 2.03 u[IU]/mL (ref 0.450–4.500)

## 2015-10-12 NOTE — Progress Notes (Signed)
Patient aware  ED

## 2015-10-17 ENCOUNTER — Other Ambulatory Visit: Payer: Self-pay | Admitting: Family Medicine

## 2015-10-18 ENCOUNTER — Ambulatory Visit: Admission: RE | Admit: 2015-10-18 | Payer: Medicare Other | Source: Ambulatory Visit | Admitting: Internal Medicine

## 2015-10-18 ENCOUNTER — Encounter: Payer: Self-pay | Admitting: *Deleted

## 2015-10-18 ENCOUNTER — Encounter: Admission: RE | Payer: Self-pay | Source: Ambulatory Visit

## 2015-10-18 HISTORY — DX: Hyperlipidemia, unspecified: E78.5

## 2015-10-18 HISTORY — DX: Cardiac murmur, unspecified: R01.1

## 2015-10-18 HISTORY — DX: Type 2 diabetes mellitus without complications: E11.9

## 2015-10-18 HISTORY — DX: Essential (primary) hypertension: I10

## 2015-10-18 SURGERY — RIGHT/LEFT HEART CATH AND CORONARY ANGIOGRAPHY
Anesthesia: Moderate Sedation | Laterality: Bilateral

## 2015-10-27 ENCOUNTER — Encounter: Admission: RE | Disposition: E | Payer: Self-pay | Source: Ambulatory Visit | Attending: Internal Medicine

## 2015-10-27 ENCOUNTER — Inpatient Hospital Stay
Admission: RE | Admit: 2015-10-27 | Discharge: 2015-11-16 | DRG: 907 | Disposition: E | Payer: Medicare Other | Source: Ambulatory Visit | Attending: Internal Medicine | Admitting: Internal Medicine

## 2015-10-27 ENCOUNTER — Ambulatory Visit: Payer: Medicare Other

## 2015-10-27 DIAGNOSIS — R571 Hypovolemic shock: Secondary | ICD-10-CM | POA: Diagnosis not present

## 2015-10-27 DIAGNOSIS — Z8261 Family history of arthritis: Secondary | ICD-10-CM | POA: Diagnosis not present

## 2015-10-27 DIAGNOSIS — Z833 Family history of diabetes mellitus: Secondary | ICD-10-CM | POA: Diagnosis not present

## 2015-10-27 DIAGNOSIS — N141 Nephropathy induced by other drugs, medicaments and biological substances: Secondary | ICD-10-CM | POA: Diagnosis not present

## 2015-10-27 DIAGNOSIS — N184 Chronic kidney disease, stage 4 (severe): Secondary | ICD-10-CM | POA: Diagnosis present

## 2015-10-27 DIAGNOSIS — E874 Mixed disorder of acid-base balance: Secondary | ICD-10-CM | POA: Diagnosis not present

## 2015-10-27 DIAGNOSIS — Z87891 Personal history of nicotine dependence: Secondary | ICD-10-CM | POA: Diagnosis not present

## 2015-10-27 DIAGNOSIS — I130 Hypertensive heart and chronic kidney disease with heart failure and stage 1 through stage 4 chronic kidney disease, or unspecified chronic kidney disease: Secondary | ICD-10-CM | POA: Diagnosis present

## 2015-10-27 DIAGNOSIS — E785 Hyperlipidemia, unspecified: Secondary | ICD-10-CM | POA: Diagnosis present

## 2015-10-27 DIAGNOSIS — I2510 Atherosclerotic heart disease of native coronary artery without angina pectoris: Secondary | ICD-10-CM | POA: Diagnosis present

## 2015-10-27 DIAGNOSIS — I97610 Postprocedural hemorrhage and hematoma of a circulatory system organ or structure following a cardiac catheterization: Secondary | ICD-10-CM | POA: Diagnosis present

## 2015-10-27 DIAGNOSIS — Z8249 Family history of ischemic heart disease and other diseases of the circulatory system: Secondary | ICD-10-CM

## 2015-10-27 DIAGNOSIS — Z9889 Other specified postprocedural states: Secondary | ICD-10-CM

## 2015-10-27 DIAGNOSIS — I729 Aneurysm of unspecified site: Secondary | ICD-10-CM

## 2015-10-27 DIAGNOSIS — I5021 Acute systolic (congestive) heart failure: Secondary | ICD-10-CM | POA: Diagnosis not present

## 2015-10-27 DIAGNOSIS — J9600 Acute respiratory failure, unspecified whether with hypoxia or hypercapnia: Secondary | ICD-10-CM | POA: Diagnosis not present

## 2015-10-27 DIAGNOSIS — I081 Rheumatic disorders of both mitral and tricuspid valves: Secondary | ICD-10-CM | POA: Diagnosis present

## 2015-10-27 DIAGNOSIS — E1122 Type 2 diabetes mellitus with diabetic chronic kidney disease: Secondary | ICD-10-CM | POA: Diagnosis present

## 2015-10-27 DIAGNOSIS — N170 Acute kidney failure with tubular necrosis: Secondary | ICD-10-CM | POA: Diagnosis not present

## 2015-10-27 DIAGNOSIS — Z95828 Presence of other vascular implants and grafts: Secondary | ICD-10-CM

## 2015-10-27 DIAGNOSIS — I272 Other secondary pulmonary hypertension: Secondary | ICD-10-CM | POA: Diagnosis present

## 2015-10-27 DIAGNOSIS — T508X5A Adverse effect of diagnostic agents, initial encounter: Secondary | ICD-10-CM | POA: Diagnosis not present

## 2015-10-27 DIAGNOSIS — N183 Chronic kidney disease, stage 3 (moderate): Secondary | ICD-10-CM | POA: Diagnosis present

## 2015-10-27 DIAGNOSIS — R34 Anuria and oliguria: Secondary | ICD-10-CM | POA: Diagnosis not present

## 2015-10-27 DIAGNOSIS — I350 Nonrheumatic aortic (valve) stenosis: Secondary | ICD-10-CM | POA: Diagnosis present

## 2015-10-27 DIAGNOSIS — Z803 Family history of malignant neoplasm of breast: Secondary | ICD-10-CM | POA: Diagnosis not present

## 2015-10-27 DIAGNOSIS — I480 Paroxysmal atrial fibrillation: Secondary | ICD-10-CM | POA: Diagnosis present

## 2015-10-27 DIAGNOSIS — R570 Cardiogenic shock: Secondary | ICD-10-CM | POA: Diagnosis not present

## 2015-10-27 DIAGNOSIS — Z8041 Family history of malignant neoplasm of ovary: Secondary | ICD-10-CM

## 2015-10-27 DIAGNOSIS — I429 Cardiomyopathy, unspecified: Secondary | ICD-10-CM | POA: Diagnosis present

## 2015-10-27 DIAGNOSIS — E872 Acidosis: Secondary | ICD-10-CM | POA: Diagnosis not present

## 2015-10-27 DIAGNOSIS — D631 Anemia in chronic kidney disease: Secondary | ICD-10-CM | POA: Diagnosis present

## 2015-10-27 DIAGNOSIS — R0600 Dyspnea, unspecified: Secondary | ICD-10-CM | POA: Diagnosis not present

## 2015-10-27 DIAGNOSIS — D62 Acute posthemorrhagic anemia: Secondary | ICD-10-CM | POA: Diagnosis not present

## 2015-10-27 DIAGNOSIS — Z8673 Personal history of transient ischemic attack (TIA), and cerebral infarction without residual deficits: Secondary | ICD-10-CM | POA: Diagnosis not present

## 2015-10-27 DIAGNOSIS — I959 Hypotension, unspecified: Secondary | ICD-10-CM | POA: Diagnosis present

## 2015-10-27 DIAGNOSIS — I724 Aneurysm of artery of lower extremity: Secondary | ICD-10-CM | POA: Diagnosis present

## 2015-10-27 DIAGNOSIS — D696 Thrombocytopenia, unspecified: Secondary | ICD-10-CM | POA: Diagnosis present

## 2015-10-27 DIAGNOSIS — N179 Acute kidney failure, unspecified: Secondary | ICD-10-CM | POA: Diagnosis not present

## 2015-10-27 DIAGNOSIS — E1165 Type 2 diabetes mellitus with hyperglycemia: Secondary | ICD-10-CM | POA: Diagnosis present

## 2015-10-27 DIAGNOSIS — I97630 Postprocedural hematoma of a circulatory system organ or structure following a cardiac catheterization: Secondary | ICD-10-CM | POA: Diagnosis present

## 2015-10-27 DIAGNOSIS — I97418 Intraoperative hemorrhage and hematoma of a circulatory system organ or structure complicating other circulatory system procedure: Secondary | ICD-10-CM

## 2015-10-27 HISTORY — PX: CARDIAC CATHETERIZATION: SHX172

## 2015-10-27 LAB — HEMOGLOBIN: Hemoglobin: 9.5 g/dL — ABNORMAL LOW (ref 12.0–16.0)

## 2015-10-27 LAB — CBC
HCT: 30.1 % — ABNORMAL LOW (ref 35.0–47.0)
HEMOGLOBIN: 9.9 g/dL — AB (ref 12.0–16.0)
MCH: 31.3 pg (ref 26.0–34.0)
MCHC: 32.9 g/dL (ref 32.0–36.0)
MCV: 95 fL (ref 80.0–100.0)
Platelets: 180 10*3/uL (ref 150–440)
RBC: 3.17 MIL/uL — ABNORMAL LOW (ref 3.80–5.20)
RDW: 13.8 % (ref 11.5–14.5)
WBC: 6.6 10*3/uL (ref 3.6–11.0)

## 2015-10-27 LAB — COMPREHENSIVE METABOLIC PANEL
ALK PHOS: 42 U/L (ref 38–126)
ALT: 7 U/L — ABNORMAL LOW (ref 14–54)
ANION GAP: 9 (ref 5–15)
AST: 13 U/L — ABNORMAL LOW (ref 15–41)
Albumin: 2.9 g/dL — ABNORMAL LOW (ref 3.5–5.0)
BILIRUBIN TOTAL: 0.7 mg/dL (ref 0.3–1.2)
BUN: 55 mg/dL — ABNORMAL HIGH (ref 6–20)
CALCIUM: 7.9 mg/dL — AB (ref 8.9–10.3)
CO2: 30 mmol/L (ref 22–32)
Chloride: 101 mmol/L (ref 101–111)
Creatinine, Ser: 2.37 mg/dL — ABNORMAL HIGH (ref 0.44–1.00)
GFR, EST AFRICAN AMERICAN: 21 mL/min — AB (ref >60)
GFR, EST NON AFRICAN AMERICAN: 18 mL/min — AB (ref >60)
Glucose, Bld: 284 mg/dL — ABNORMAL HIGH (ref 65–99)
Potassium: 4.3 mmol/L (ref 3.5–5.1)
SODIUM: 140 mmol/L (ref 135–145)
TOTAL PROTEIN: 5.4 g/dL — AB (ref 6.5–8.1)

## 2015-10-27 LAB — TSH: TSH: 0.8 u[IU]/mL (ref 0.350–4.500)

## 2015-10-27 LAB — GLUCOSE, CAPILLARY: Glucose-Capillary: 318 mg/dL — ABNORMAL HIGH (ref 65–99)

## 2015-10-27 LAB — PROTIME-INR
INR: 1.18
PROTHROMBIN TIME: 15.2 s — AB (ref 11.4–15.0)

## 2015-10-27 LAB — MRSA PCR SCREENING: MRSA by PCR: NEGATIVE

## 2015-10-27 LAB — APTT

## 2015-10-27 SURGERY — RIGHT/LEFT HEART CATH AND CORONARY ANGIOGRAPHY
Anesthesia: Moderate Sedation

## 2015-10-27 MED ORDER — ASPIRIN EC 325 MG PO TBEC: 325.0000 mg | DELAYED_RELEASE_TABLET | Freq: Every day | ORAL | Status: DC

## 2015-10-27 MED ORDER — SODIUM CHLORIDE 0.9 % WEIGHT BASED INFUSION: 3.0000 mL/kg/h | INTRAVENOUS | Status: DC

## 2015-10-27 MED ORDER — SODIUM CHLORIDE 0.9 % IV SOLN: 250.0000 mL | INTRAVENOUS | Status: DC | PRN

## 2015-10-27 MED ORDER — SODIUM BICARBONATE 8.4 % IV SOLN
INTRAVENOUS | Status: DC
  Filled 2015-10-27: qty 1000

## 2015-10-27 MED ORDER — SODIUM CHLORIDE 0.9 % IV BOLUS (SEPSIS)
500.0000 mL | Freq: Once | INTRAVENOUS | Status: AC
  Administered 2015-10-27: 500 mL via INTRAVENOUS

## 2015-10-27 MED ORDER — DEXTROSE 5 % IV SOLN
0.0000 ug/min | INTRAVENOUS | Status: DC
  Administered 2015-10-27: 20 ug/min via INTRAVENOUS
  Administered 2015-10-28 (×2): 200 ug/min via INTRAVENOUS
  Administered 2015-10-28: 220 ug/min via INTRAVENOUS
  Administered 2015-10-28 (×2): 250 ug/min via INTRAVENOUS
  Administered 2015-10-29: 260 ug/min via INTRAVENOUS
  Administered 2015-10-29: 270 ug/min via INTRAVENOUS
  Administered 2015-10-29: 260 ug/min via INTRAVENOUS
  Administered 2015-10-29: 250 ug/min via INTRAVENOUS
  Filled 2015-10-27 (×14): qty 4

## 2015-10-27 MED ORDER — SODIUM CHLORIDE 0.9% FLUSH: 3.0000 mL | INTRAVENOUS | Status: DC | PRN

## 2015-10-27 MED ORDER — IOHEXOL 300 MG/ML  SOLN
INTRAMUSCULAR | Status: DC | PRN
  Administered 2015-10-27: 130 mL via INTRA_ARTERIAL

## 2015-10-27 MED ORDER — SODIUM CHLORIDE 0.9 % WEIGHT BASED INFUSION
1.0000 mL/kg/h | INTRAVENOUS | Status: AC
  Administered 2015-10-27: 1 mL/kg/h via INTRAVENOUS

## 2015-10-27 MED ORDER — SODIUM CHLORIDE 0.9 % IV SOLN
INTRAVENOUS | Status: DC
  Administered 2015-10-27 – 2015-10-31 (×6): via INTRAVENOUS

## 2015-10-27 MED ORDER — INSULIN ASPART 100 UNIT/ML ~~LOC~~ SOLN
0.0000 [IU] | Freq: Three times a day (TID) | SUBCUTANEOUS | Status: DC
  Administered 2015-10-28 (×2): 3 [IU] via SUBCUTANEOUS
  Administered 2015-10-29: 2 [IU] via SUBCUTANEOUS
  Administered 2015-10-29 (×2): 5 [IU] via SUBCUTANEOUS
  Administered 2015-10-30 (×3): 3 [IU] via SUBCUTANEOUS
  Administered 2015-10-31 (×2): 2 [IU] via SUBCUTANEOUS
  Filled 2015-10-27: qty 3
  Filled 2015-10-27: qty 2
  Filled 2015-10-27 (×2): qty 3
  Filled 2015-10-27: qty 5
  Filled 2015-10-27: qty 3
  Filled 2015-10-27: qty 2
  Filled 2015-10-27: qty 5
  Filled 2015-10-27: qty 3
  Filled 2015-10-27: qty 2

## 2015-10-27 MED ORDER — ACETAMINOPHEN 325 MG PO TABS
650.0000 mg | ORAL_TABLET | ORAL | Status: DC | PRN
  Administered 2015-10-27 – 2015-10-30 (×5): 650 mg via ORAL
  Filled 2015-10-27 (×5): qty 2

## 2015-10-27 MED ORDER — SODIUM CHLORIDE 0.9 % WEIGHT BASED INFUSION: 1.0000 mL/kg/h | INTRAVENOUS | Status: DC

## 2015-10-27 MED ORDER — SODIUM CHLORIDE 0.9% FLUSH
3.0000 mL | Freq: Two times a day (BID) | INTRAVENOUS | Status: DC
  Administered 2015-10-27 – 2015-10-31 (×6): 3 mL via INTRAVENOUS

## 2015-10-27 MED ORDER — INSULIN ASPART 100 UNIT/ML ~~LOC~~ SOLN
0.0000 [IU] | Freq: Every day | SUBCUTANEOUS | Status: DC
  Administered 2015-10-27: 4 [IU] via SUBCUTANEOUS
  Administered 2015-10-28: 3 [IU] via SUBCUTANEOUS
  Filled 2015-10-27: qty 3
  Filled 2015-10-27: qty 4

## 2015-10-27 MED ORDER — CLOPIDOGREL BISULFATE 75 MG PO TABS: 75.0000 mg | ORAL_TABLET | Freq: Every day | ORAL | Status: DC

## 2015-10-27 MED ORDER — SODIUM CHLORIDE 0.9 % IV SOLN
INTRAVENOUS | Status: DC
  Administered 2015-10-27: 15:00:00 via INTRAVENOUS

## 2015-10-27 MED ORDER — SODIUM CHLORIDE 0.9 % IV SOLN
Freq: Once | INTRAVENOUS | Status: AC
  Administered 2015-10-27: 17:00:00 via INTRAVENOUS

## 2015-10-27 MED ORDER — ONDANSETRON HCL 4 MG/2ML IJ SOLN
4.0000 mg | Freq: Four times a day (QID) | INTRAMUSCULAR | Status: DC | PRN
  Administered 2015-10-28 – 2015-10-29 (×2): 4 mg via INTRAVENOUS
  Filled 2015-10-27: qty 2

## 2015-10-27 MED ORDER — SODIUM BICARBONATE BOLUS VIA INFUSION
INTRAVENOUS | Status: AC
  Administered 2015-10-27: 13:00:00 via INTRAVENOUS
  Filled 2015-10-27: qty 1

## 2015-10-27 SURGICAL SUPPLY — 12 items
CATH INFINITI 5FR ANG PIGTAIL (CATHETERS) ×3 IMPLANT
CATH INFINITI 5FR JL4 (CATHETERS) ×3 IMPLANT
CATH INFINITI JR4 5F (CATHETERS) ×3 IMPLANT
CATH SWANZ 7F THERMO (CATHETERS) ×3 IMPLANT
GUIDEWIRE EMER 3M J .025X150CM (WIRE) ×6 IMPLANT
KIT MANI 3VAL PERCEP (MISCELLANEOUS) ×3 IMPLANT
KIT RIGHT HEART (MISCELLANEOUS) ×3 IMPLANT
NEEDLE PERC 18GX7CM (NEEDLE) ×3 IMPLANT
PACK CARDIAC CATH (CUSTOM PROCEDURE TRAY) ×3 IMPLANT
SHEATH AVANTI 5FR X 11CM (SHEATH) ×3 IMPLANT
SHEATH PINNACLE 7F 10CM (SHEATH) ×3 IMPLANT
WIRE EMERALD 3MM-J .035X150CM (WIRE) ×3 IMPLANT

## 2015-10-27 NOTE — Progress Notes (Signed)
Dr. Juliann Pares paged a few times and when able to get in touch with him. notified MD that patient's blood pressure only came up to 90/39 after 500 cc bolus. MD stated he does not want to start patient on a drip at this time due to her afib and the rate. Patient has currently been in NSR in the upper 90's, but will switch back and forth to Afib. MD stated to leave her pressure for now and put patient in step down for closer monitoring. MD also stated to add a hemoglobin to labs to make sure the level has not dropped from hematoma. Night charge has been notified and bed request has been placed.

## 2015-10-27 NOTE — Progress Notes (Signed)
Upon arrival patient's blood pressure was 85/29 in the right arm. Left arm reads very inaccurately at 175/120. Dr. Juliann Pares paged and MD stated to give another bolus of 500cc. Also to discontinue aspirin and plavix orders due to hematoma during procedure. Bolus has been started and pressure is not improving yet. Will check again once bolus has finished. If not improving MD will be paged. Patient is asymptomatic. Face is slightly pale, but does not feel light headed at all. Lungs are clear.

## 2015-10-27 NOTE — Progress Notes (Signed)
Pt transferring to stepdown room 9. BP remains low, pt has no other concerns at this time. Report called to [facility], Charity fundraiser. Pt transported by bed with this RN and Nehemiah Settle, Charity fundraiser. Syliva Overman, RN

## 2015-10-27 NOTE — Progress Notes (Signed)
Pt clinically stable,, ct scan done, no further bleeding nor hematoma at right groin site,  Pt alert and oriented, taking po's without difficulty

## 2015-10-27 NOTE — Progress Notes (Signed)
Pt clinically stable, son present, pt ate supper tolerated well, to be admitted to 2a for observation overnight. Dr Juliann Pares in to see pt. Again prior to orders given, no changes in rigtht groin, no new formation of bleeding nor hematoma. Report called to Jeannette How on 2a with plan reviewed. Pt to 2a 235

## 2015-10-27 NOTE — Progress Notes (Signed)
Pressure has dropped more to 77/30. Charge nurse notified and CCU has been called to move the patient quickly. Care has been turned over to [place], Charity fundraiser. She will call report to CCU. Patient is still asymptomatic and states she "just isn't moving much in the bed right now".

## 2015-10-27 NOTE — Progress Notes (Signed)
eLink Physician-Brief Progress Note Patient Name: Linda Jones DOB: Oct 18, 1931 MRN: [id number]   Date of Service  11/06/2015  HPI/Events of Note  82 yo female with PMH of HTN, Carotid Stenosis, CVA, Anemia, DM - Type II, B12 Deficiency, BMI > 30, RLS and Aortic Stenosis.  Underwent cardiac cath today. Finding >> Aortic Stenosis. Complicated by insertion site hematoma and hematoma. Hgb = 9.5. BP = 90/32 and HR = 102. Sat = 96% and RR = 20. Management per Cardiology. No H&P in EPIC.  eICU Interventions  Continue present management.      Intervention Category Evaluation Type: New Patient Evaluation  Lenell Antu 10/29/2015, 9:21 PM

## 2015-10-27 NOTE — Progress Notes (Signed)
Pt coming out to recovery, visible hematoma forming right groin  Site with pressure held per me as well as cath personnel, lessing after 46min's to no hematoma, pt vagal response, with decreasing sbp, calling Dr Juliann Pares with ns iv 250 bolus being given, sbp increasing back to baseline, alert and oriented this entire time. Speaking with pt's sons in regards to bp/ and ct scan to be done to rule out bleed. afib per monitor,

## 2015-10-28 ENCOUNTER — Inpatient Hospital Stay: Payer: Medicare Other

## 2015-10-28 ENCOUNTER — Encounter: Admission: RE | Disposition: E | Payer: Self-pay | Source: Ambulatory Visit | Attending: Internal Medicine

## 2015-10-28 HISTORY — PX: PERIPHERAL VASCULAR CATHETERIZATION: SHX172C

## 2015-10-28 LAB — GLUCOSE, CAPILLARY
Glucose-Capillary: 218 mg/dL — ABNORMAL HIGH (ref 65–99)
Glucose-Capillary: 226 mg/dL — ABNORMAL HIGH (ref 65–99)
Glucose-Capillary: 214 mg/dL — ABNORMAL HIGH (ref 65–99)
Glucose-Capillary: 270 mg/dL — ABNORMAL HIGH (ref 65–99)

## 2015-10-28 LAB — CBC
HEMATOCRIT: 26.1 % — AB (ref 35.0–47.0)
HEMOGLOBIN: 8.4 g/dL — AB (ref 12.0–16.0)
MCH: 31.3 pg (ref 26.0–34.0)
MCHC: 32.3 g/dL (ref 32.0–36.0)
MCV: 97 fL (ref 80.0–100.0)
Platelets: 196 10*3/uL (ref 150–440)
RBC: 2.69 MIL/uL — AB (ref 3.80–5.20)
RDW: 13.9 % (ref 11.5–14.5)
WBC: 10 10*3/uL (ref 3.6–11.0)

## 2015-10-28 LAB — ABO/RH: ABO/RH(D): O POS

## 2015-10-28 LAB — PREPARE RBC (CROSSMATCH)

## 2015-10-28 LAB — HEMOGLOBIN
HEMOGLOBIN: 8.6 g/dL — AB (ref 12.0–16.0)
Hemoglobin: 9.9 g/dL — ABNORMAL LOW (ref 12.0–16.0)

## 2015-10-28 SURGERY — LOWER EXTREMITY ANGIOGRAPHY
Anesthesia: Moderate Sedation | Laterality: Right

## 2015-10-28 MED ORDER — MIDAZOLAM HCL 2 MG/2ML IJ SOLN
INTRAMUSCULAR | Status: DC | PRN
  Administered 2015-10-28: 1 mg via INTRAVENOUS

## 2015-10-28 MED ORDER — FENTANYL CITRATE (PF) 100 MCG/2ML IJ SOLN
INTRAMUSCULAR | Status: AC
  Filled 2015-10-28: qty 2

## 2015-10-28 MED ORDER — ONDANSETRON HCL 4 MG/2ML IJ SOLN
INTRAMUSCULAR | Status: AC
  Filled 2015-10-28: qty 2

## 2015-10-28 MED ORDER — SODIUM CHLORIDE 0.9 % IV SOLN
Freq: Once | INTRAVENOUS | Status: AC
  Administered 2015-10-28: 05:00:00 via INTRAVENOUS

## 2015-10-28 MED ORDER — SODIUM CHLORIDE 0.9 % IV SOLN: Freq: Once | INTRAVENOUS | Status: AC

## 2015-10-28 MED ORDER — DOPAMINE-DEXTROSE 3.2-5 MG/ML-% IV SOLN
INTRAVENOUS | Status: AC
  Filled 2015-10-28: qty 250

## 2015-10-28 MED ORDER — DOPAMINE-DEXTROSE 3.2-5 MG/ML-% IV SOLN
0.0000 ug/kg/min | INTRAVENOUS | Status: DC
  Administered 2015-10-28: 5 ug/kg/min via INTRAVENOUS
  Administered 2015-10-28: 10.5 ug/kg/min via INTRAVENOUS
  Administered 2015-10-29: 10 ug/kg/min via INTRAVENOUS

## 2015-10-28 MED ORDER — FUROSEMIDE 10 MG/ML IJ SOLN
20.0000 mg | Freq: Once | INTRAMUSCULAR | Status: AC
  Administered 2015-10-29: 20 mg via INTRAVENOUS
  Filled 2015-10-28: qty 2

## 2015-10-28 MED ORDER — DOPAMINE-DEXTROSE 3.2-5 MG/ML-% IV SOLN
5.0000 ug/kg/min | INTRAVENOUS | Status: DC
  Administered 2015-10-28: 5 ug/kg/min via INTRAVENOUS

## 2015-10-28 MED ORDER — FENTANYL CITRATE (PF) 100 MCG/2ML IJ SOLN
INTRAMUSCULAR | Status: DC | PRN
  Administered 2015-10-28: 25 ug via INTRAVENOUS

## 2015-10-28 MED ORDER — LIDOCAINE-EPINEPHRINE (PF) 1 %-1:200000 IJ SOLN
INTRAMUSCULAR | Status: AC
  Filled 2015-10-28: qty 30

## 2015-10-28 MED ORDER — ACETYLCYSTEINE 20 % IN SOLN
600.0000 mg | Freq: Two times a day (BID) | RESPIRATORY_TRACT | Status: DC
  Administered 2015-10-29 – 2015-10-30 (×2): 600 mg via ORAL
  Filled 2015-10-28 (×2): qty 4

## 2015-10-28 MED ORDER — MIDAZOLAM HCL 5 MG/5ML IJ SOLN
INTRAMUSCULAR | Status: AC
  Filled 2015-10-28: qty 5

## 2015-10-28 MED ORDER — HEPARIN (PORCINE) IN NACL 2-0.9 UNIT/ML-% IJ SOLN
INTRAMUSCULAR | Status: AC
  Filled 2015-10-28: qty 1000

## 2015-10-28 MED ORDER — HEPARIN SODIUM (PORCINE) 1000 UNIT/ML IJ SOLN
INTRAMUSCULAR | Status: AC
  Filled 2015-10-28: qty 1

## 2015-10-28 MED ORDER — ACETYLCYSTEINE 20 % IN SOLN: 600.0000 mg | Freq: Two times a day (BID) | RESPIRATORY_TRACT | Status: DC

## 2015-10-28 SURGICAL SUPPLY — 14 items
BALLN ULTRV 018 5X60X75 (BALLOONS) ×4
BALLOON ULTRV 018 5X60X75 (BALLOONS) ×2 IMPLANT
CATH PIG 70CM (CATHETERS) ×4 IMPLANT
DEVICE PRESTO INFLATION (MISCELLANEOUS) ×4 IMPLANT
DEVICE STARCLOSE SE CLOSURE (Vascular Products) ×4 IMPLANT
GLIDEWIRE ADV .035X180CM (WIRE) ×4 IMPLANT
PACK ANGIOGRAPHY (CUSTOM PROCEDURE TRAY) ×4 IMPLANT
SHEATH ANL2 6FRX45 HC (SHEATH) ×4 IMPLANT
SHEATH BRITE TIP 5FRX11 (SHEATH) ×4 IMPLANT
STENT VIABAHN 6X50X120 (Permanent Stent) ×4 IMPLANT
SYR MEDRAD MARK V 150ML (SYRINGE) ×4 IMPLANT
TUBING CONTRAST HIGH PRESS 72 (TUBING) ×4 IMPLANT
WIRE G V18X300CM (WIRE) ×4 IMPLANT
WIRE J 3MM .035X145CM (WIRE) ×4 IMPLANT

## 2015-10-28 NOTE — H&P (Signed)
See previous notes.

## 2015-10-28 NOTE — Progress Notes (Signed)
eLink Physician-Brief Progress Note Patient Name: NEHAL WITTING DOB: 01/31/1932 MRN: [id number]   Date of Service  11/09/2015  HPI/Events of Note  Notified by RN of persistent hypotension despite gentle IVF bolus & Neosynephrine at 224mcg/min through PIV. Patient reportedly has aortic stenosis. Cardiology patient.   eICU Interventions  Asked nurse to contact primary to get further direction on IVF bolus versus placement of central line for vasopressors given the potential for limb ischemia with increasing doses.     Intervention Category Major Interventions: Hypotension - evaluation and management  Lawanda Cousins 11/02/2015, 3:47 AM

## 2015-10-28 NOTE — Consult Note (Signed)
[facility] Vascular Consult Note  MRN : [id number]  Linda Jones is a 82 y.o. (01-Aug-1932) female who presents with chief complaint of No chief complaint on file. Marland Kitchen  History of Present Illness: I am asked to see the patient by Dr. Juliann Pares for evaluation of her right thigh hematoma.  Patient admitted after cardiac catheterization yesterday when she developed low BP and right thigh hematoma.  Having some more pain in the thigh and leg today.  Now complains of a dull ache.  Foot is warm and she denies any ischemic leg symptoms.  Her Hgb started around 10 and is 8.4 this am.  Has required Neosynepherine.  Her mean BP is currently 68.   Has had a CT scan without contrast last night that I have independently reviewed and shows a large right thigh hematoma in the anterior leg.  No retroperitoneal blood.    Current Facility-Administered Medications  Medication Dose Route Frequency Provider Last Rate Last Dose  . 0.9 %  sodium chloride infusion   Intravenous Continuous Alwyn Pea, MD 50 mL/hr at 11/18/15 1908    . acetaminophen (TYLENOL) tablet 650 mg  650 mg Oral Q4H PRN Alwyn Pea, MD   650 mg at 11/07/2015 0235  . insulin aspart (novoLOG) injection 0-5 Units  0-5 Units Subcutaneous QHS Alwyn Pea, MD   4 Units at 11/18/2015 2204  . insulin aspart (novoLOG) injection 0-9 Units  0-9 Units Subcutaneous TID WC Alwyn Pea, MD   3 Units at 11/07/2015 0813  . ondansetron (ZOFRAN) injection 4 mg  4 mg Intravenous Q6H PRN Dwayne D Callwood, MD      . phenylephrine (NEO-SYNEPHRINE) 40 mg in dextrose 5 % 250 mL (0.16 mg/mL) infusion  0-400 mcg/min Intravenous Titrated Dwayne D Callwood, MD 75 mL/hr at 11/07/2015 0813 200 mcg/min at 10/27/2015 0813  . sodium chloride flush (NS) 0.9 % injection 3 mL  3 mL Intravenous Q12H Dwayne D Callwood, MD   3 mL at 2015-11-18 1839  . sodium chloride flush (NS) 0.9 % injection 3 mL  3 mL Intravenous PRN Alwyn Pea, MD         Past Medical History  Diagnosis Date  . Heart murmur   . Diabetes mellitus without complication (HCC)   . Hypertension   . Hyperlipemia     Past Surgical History  Procedure Laterality Date  . Hernia repair  2005    umbilical  . Abdominal hysterectomy  2002    and BSO due to fibroids    Social History Social History  Substance Use Topics  . Smoking status: Former Smoker -- 2 years  . Smokeless tobacco: None  . Alcohol Use: No  No IVDU  Family History Family History  Problem Relation Age of Onset  . Heart disease Mother   . Hypertension Mother   . Diabetes Mother   . Heart attack Mother   . Arthritis Father   . Heart disease Father   . Heart attack Father   . CAD Brother   . Ovarian cancer Maternal Aunt   . Breast cancer Maternal Aunt   . CAD Brother     Allergies  Allergen Reactions  . Erythromycin      REVIEW OF SYSTEMS (Negative unless checked)  Constitutional: [] Weight loss  [] Fever  [] Chills Cardiac: [] Chest pain   [x] Chest pressure   [x] Palpitations   [] Shortness of breath when laying flat   [] Shortness of breath at rest   []   Shortness of breath with exertion. Vascular:  Pain in legs with walking   Pain in legs at rest   Pain in legs when laying flat   Claudication   Pain in feet when walking  Pain in feet at rest  Pain in feet when laying flat   History of DVT   Phlebitis   Swelling in legs   Varicose veins   Non-healing ulcers Pulmonary:   Uses home oxygen   Productive cough   Hemoptysis   Wheeze  COPD   Asthma Neurologic:  Dizziness  Blackouts   Seizures   History of stroke   History of TIA  Aphasia   Temporary blindness   Dysphagia   Weakness or numbness in arms   Weakness or numbness in legs Musculoskeletal:  Arthritis   Joint swelling   Joint pain   Low back pain Hematologic:  Easy bruising  Easy bleeding   Hypercoagulable state   Anemic  Hepatitis Gastrointestinal:   Blood in stool   Vomiting blood  Gastroesophageal reflux/heartburn   Difficulty swallowing. Genitourinary:  Chronic kidney disease   Difficult urination  Frequent urination  Burning with urination   Blood in urine Skin:  Rashes   Ulcers   Wounds Psychological:  History of anxiety    History of major depression.  Physical Examination  Filed Vitals:   11/16/2015 0600 2015/11/16 0605 11-16-15 0753 11-16-15 0800  BP: 85/38  Pulse: 96 101 99 100  Temp:  98.2 F (36.8 C) 97.9 F (36.6 C)   TempSrc:  Oral Oral   Resp: Height:      Weight:      SpO2: 100% 97% 96% 86%   Body mass index is 29.2 kg/(m^2). Gen:  WD/WN, NAD Head: [place]/AT, No temporalis wasting. Prominent temp pulse not noted. Ear/Nose/Throat: Hearing grossly intact, nares w/o erythema or drainage, oropharynx w/o Erythema/Exudate Eyes: PERRLA, EOMI.  Neck: Supple, no nuchal rigidity.  No JVD.  Pulmonary:  Good air movement, clear to auscultation bilaterally.  Cardiac: Regular but tachycardic Vascular:  Vessel Right Left  Radial Palpable Palpable  Ulnar Palpable Palpable  Brachial Palpable Palpable  Carotid Palpable, without bruit Palpable, without bruit  Aorta Not palpable N/A  Femoral Palpable Palpable  Popliteal Not Palpable Not Palpable  PT Palpable Not Palpable  DP Not Palpable Palpable   Gastrointestinal: soft, non-tender/non-distended. No guarding/reflex. No masses, surgical incisions, or scars. Musculoskeletal: M/S 5/5 throughout.  Extremities without ischemic changes.  No deformity or atrophy. No edema. Neurologic: CN 2-12 intact. Pain and light touch intact in extremities.  Symmetrical.  Speech is fluent. Motor exam as listed above. Psychiatric: Judgment intact, Mood & affect appropriate for pt's clinical situation. Dermatologic: No rashes or ulcers noted. Right groin access incision is clean.  No active bleeding.  Right thigh hematoma is present, but  actually not as obvious as I would have expected based on CT scan.  Bruising is pretty minimal at this point. Lymph : No Cervical, Axillary, or Inguinal lymphadenopathy.  Diagnostic Studies CT scan as above that I have independently reviewed     CBC Lab Results  Component Value Date   WBC 10.0 2015/11/16   HGB 8.4* 2015-11-16   HCT 26.1* 11-16-15   MCV 97.0 2015/11/16   PLT 196 11-16-15    BMET    Component Value Date/Time   NA 140 10/26/2015 1941   NA 143 10/10/2015 1630   NA 140 05/13/2012 1105   K 4.3  11/09/2015 1941   K 4.6 05/13/2012 1105   CL 101 10/23/2015 1941   CL 104 05/13/2012 1105   CO2 30 11/06/2015 1941   CO2 33* 05/13/2012 1105   GLUCOSE 284* 11/05/2015 1941   GLUCOSE 139* 10/10/2015 1630   GLUCOSE 117* 05/13/2012 1105   BUN 55* 11/02/2015 1941   BUN 36* 10/10/2015 1630   BUN 30* 05/13/2012 1105   CREATININE 2.37* 10/22/2015 1941   CREATININE 1.4* 11/24/2014   CREATININE 1.05 05/13/2012 1105   CALCIUM 7.9* 11/03/2015 1941   CALCIUM 9.9 05/13/2012 1105   GFRNONAA 18* 11/10/2015 1941   GFRNONAA 51* 05/13/2012 1105   GFRAA 21* 11/03/2015 1941   GFRAA 59* 05/13/2012 1105   Estimated Creatinine Clearance: 18 mL/min (by C-G formula based on Cr of 2.37).  COAG Lab Results  Component Value Date   INR 1.18 11/15/2015    Radiology Ct Abdomen Pelvis Wo Contrast  11/09/2015  CLINICAL DATA:  82 year old female with right-sided leg swelling and bruising following 8 cardiac catheterization today. Evaluate for potential retroperitoneal hemorrhage. EXAM: CT ABDOMEN AND PELVIS WITHOUT CONTRAST TECHNIQUE: Multidetector CT imaging of the abdomen and pelvis was performed following the standard protocol without IV contrast. COMPARISON:  No priors. FINDINGS: Lower chest: Trace bilateral pleural effusions. Heart size is mildly enlarged with left ventricular dilatation. Severe calcifications of the aortic valve and mitral valve/annulus. Atherosclerotic calcifications  are noted within the left circumflex and right coronary arteries. Hepatobiliary: No definite cystic or solid hepatic lesions are identified on today's noncontrast CT examination. Tiny calcified granuloma in the central aspect of the liver. Small calcified gallstones are noted dependently in the gallbladder. No current findings to suggest an acute cholecystitis at this time. Pancreas: No pancreatic mass or peripancreatic inflammatory changes on today's noncontrast CT examination. Spleen: Unremarkable. Adrenals/Urinary Tract: Kidneys are mildly atrophic bilaterally. Mild nodular thickening of the adrenal glands bilaterally appears relatively symmetric. No hydroureteronephrosis. Urinary bladder is normal in appearance. Stomach/Bowel: Unenhanced appearance of the stomach is normal. No pathologic dilatation of small bowel or colon. Numerous colonic diverticulae are noted, particularly throughout the descending colon and sigmoid colon, without surrounding inflammatory changes to suggest an acute diverticulitis at this time. Normal appendix. Vascular/Lymphatic: Atherosclerotic calcifications throughout the abdominal and pelvic vasculature, without definite aneurysm. No lymphadenopathy noted in the abdomen or pelvis. Reproductive: Status post hysterectomy. Ovaries are not confidently identified may be surgically absent or atrophic. Other: No high attenuation fluid collection in the retroperitoneum or within the peritoneal cavity. However, there is marked expansion and high attenuation throughout the anterior compartment of the visualized upper thigh immediately superficial to the right femoral arteries, presumably a large amount of intramuscular hemorrhage. Some intramuscular hemorrhage is also noted in the short adductors. This results in expansion of this musculature up to 9.8 x 7.9 cm (image 98 of series 2), which is markedly asymmetric with the contralateral side. Postoperative changes of mesh repair for ventral hernia  or umbilical hernia are noted. Notably, there is recurrent herniation adjacent to the left side of the mesh, containing a short segment of the mid transverse colon. Musculoskeletal: There are no aggressive appearing lytic or blastic lesions noted in the visualized portions of the skeleton. IMPRESSION: 1. Large amount of intramuscular hemorrhage within the anterior compartment of the upper right thigh and short adductors, as above. 2. No retroperitoneal hemorrhage identified at this time. 3. Status post mesh repair for ventral or umbilical hernia, with evidence of recurrent herniation along the left lateral aspect of the mesh,  containing a short segment of mid transverse colon. No evidence of associated bowel incarceration or obstruction at this time. 4. Severe colonic diverticulosis without evidence of acute diverticulitis at this time. 5. Trace bilateral pleural effusions. 6. Mild cardiomegaly with left ventricular dilatation. 7. Atherosclerosis, including at least 2 vessel coronary artery disease. 8. There are calcifications of the aortic valve and mitral valve/annulus. Echocardiographic correlation for evaluation of potential valvular dysfunction may be warranted if clinically indicated. 9. Additional incidental findings, as above. These results will be called to the ordering clinician or representative by the Radiologist Assistant, and communication documented in the PACS or zVision Dashboard. Electronically Signed   By: Trudie Reed M.D.   On: 10/26/2015 16:02   Dg Chest 2 View  10/12/2015  CLINICAL DATA:  Nonproductive cough for 1 month. Short of breath. Fatigue. EXAM: CHEST  2 VIEW COMPARISON:  01/01/2011 FINDINGS: Normal mediastinum and cardiac silhouette. Lungs are hyperinflated. No effusion, infiltrate or pneumothorax. Aortic calcification noted. IMPRESSION: 1. Hyperinflated lungs. 2. No acute findings. 3.  Atherosclerotic calcification of the aorta. Electronically Signed   By: Genevive Bi M.D.    On: 10/12/2015 09:07      Assessment/Plan 1. Right thigh hematoma after cardiac cath yesterday.  There does not appear to be active bleeding but would agree completely with further evaluation for this.  [place] today pending.  Cr likely precludes dye for CTA, but repeat uninfused scan may be of some benefit if [place] is worrisome for pseudoaneurysm or active bleeding. If there is active bleeding, I would start with angiogram to see if this could be controlled without open surgery. Discussed with Dr. Juliann Pares.  No operative or interventional procedures planned for now, but will need to watch closely.  Agree with blood and volume replacement as needed.   2. CKD, stage 4.  Complicates things and concern for ATN and ARF certainly exists as well.  Will try to avoid contrast for now if possible 3. CAD. The reason for the cath initially.  Also at some risk for demand ischemia with low BP and anemia.  Agree with blood transfusion 4. Acute blood loss anemia and hypotension.  From one above.  Blood and fluid as needed.    DEW,JASON, MD  11-Nov-2015 8:51 AM

## 2015-10-28 NOTE — Progress Notes (Signed)
MEDICATION RELATED CONSULT NOTE - INITIAL   Pharmacy Consult for Acetylcysteine Indication: CIN prevention  Allergies  Allergen Reactions  . Erythromycin     Patient Measurements: Height:  (160 cm) Weight: 164 lb 12.8 oz (74.753 kg) IBW/kg (Calculated) : 52.4  Vital Signs: Temp: 97.8 F (36.6 C) (02/10 1615) Temp Source: Oral (02/10 1615) BP: 72/41 mmHg (02/10 1615) Pulse Rate: 122 (02/10 1615) Intake/Output from previous day: 02/09 0701 - 02/10 0700 In: 1577.6 [I.V.:1077.6; IV Piggyback:500] Out: 500 [Urine:500] Intake/Output from this shift: Total I/O In: 20 [I.V.:20] Out: -   Labs:  Recent Labs  10/20/2015 1643 10/30/2015 1941 11/09/2015 0021 11/12/2015 0402 10/26/2015 1253  WBC 6.6  --   --  10.0  --   HGB 9.9* 9.5* 8.6* 8.4* 9.9*  HCT 30.1*  --   --  26.1*  --   PLT 180  --   --  196  --   APTT  --  <24*  --   --   --   CREATININE  --  2.37*  --   --   --   ALBUMIN  --  2.9*  --   --   --   PROT  --  5.4*  --   --   --   AST  --  13*  --   --   --   ALT  --  7*  --   --   --   ALKPHOS  --  42  --   --   --   BILITOT  --  0.7  --   --   --    Estimated Creatinine Clearance: 18 mL/min (by C-G formula based on Cr of 2.37).   Microbiology: Recent Results (from the past 720 hour(s))  MRSA PCR Screening     Status: None   Collection Time: 11/02/2015  8:22 PM  Result Value Ref Range Status   MRSA by PCR NEGATIVE NEGATIVE Final    Comment:        The GeneXpert MRSA Assay (FDA approved for NASAL specimens only), is one component of a comprehensive MRSA colonization surveillance program. It is not intended to diagnose MRSA infection nor to guide or monitor treatment for MRSA infections.     Medical History: Past Medical History  Diagnosis Date  . Heart murmur   . Diabetes mellitus without complication (HCC)   . Hypertension   . Hyperlipemia     Medications:  Scheduled:  . acetylcysteine  600 mg Oral BID  . DOPamine      . [MAR Hold] insulin  aspart  0-5 Units Subcutaneous QHS  . [MAR Hold] insulin aspart  0-9 Units Subcutaneous TID WC  . [MAR Hold] sodium chloride flush  3 mL Intravenous Q12H   Infusions:  . sodium chloride 50 mL/hr at 10/21/2015 1520  . [MAR Hold] DOPamine    . DOPamine    . [MAR Hold] phenylephrine (NEO-SYNEPHRINE) Adult infusion 200 mcg/min (10/21/2015 0813)    Assessment: 82 y/o F admitted after developing hematoma s/p cardiac catheterization. 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 Nephrology is hydrating with NS and wants acetylcysteine in addition.   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 Will order acetylcysteine 600 mg po q 12 hours x 4 doses.   Linda Jones D 10/22/2015,4:21 PM

## 2015-10-28 NOTE — Progress Notes (Signed)
Inpatient Diabetes Program Recommendations  AACE/ADA: New Consensus Statement on Inpatient Glycemic Control (2015)  Target Ranges:  Prepandial:   less than 140 mg/dL      Peak postprandial:   less than 180 mg/dL (1-2 hours)      Critically ill patients:  140 - 180 mg/dL   Review of Glycemic Control  Results for Linda Jones, Linda Jones (MRN [id number]) as of 10/19/2015 09:37  Ref. Range 10/24/2015 20:30 11/08/2015 07:50  Glucose-Capillary Latest Ref Range: 65-99 mg/dL 409 (H) 811 (H)    Diabetes history: Type 2 Outpatient Diabetes medications: Glipizide  qday, Januvia /day Current orders for Inpatient glycemic control: Novolog 0-9 units correction insulin tid with meals, Novolog 0-5 units qhs  Inpatient Diabetes Program Recommendations:  Agree with current orders for blood sugar management- continue to monitor.  May consider low dose basal insulin Lantus 8 units qhs.  Will likely not be a candidate for Glipizide at discharge.  Susette Racer, RN, BA, MHA, CDE Diabetes Coordinator Inpatient Diabetes Program  [phone] (Team Pager) [phone] [facility] Office) 11/11/2015 9:40 AM

## 2015-10-28 NOTE — Consult Note (Signed)
[facility] CONSULT NOTE    Date: 10/29/2015                  Patient Name:  Linda Jones  MRN: [id number]  DOB: November 06, 1931  Age / Sex: 82 y.o., female         PCP: Megan Mans, MD                 Service Requesting Consult: Dr. Juliann Pares                 Reason for Consult: Acute renal failure/CKD stage III            History of Present Illness: Patient is a 82 y.o. female with a PMHx of diabetes mellitus type 2, hypertension, hyperlipidemia, atrial fibrillation, carotid stenosis, CVA, B12 deficiency who was admitted to [facility] on 11-15-15 status post cardiac catheterization after she developed a right thigh hematoma. This was ultimately found to be a right sided femoral pseudoaneurysm. She is being taken by vascular surgery to address this. As part of her cardiac catheterization she received IV contrast. She now appears to have acute renal failure as she does have underlying chronic kidney disease stage III.  Her baseline creatinine ranges between 1.3-1.5. She is at high risk for contrast-induced nephropathy given the fact that she has underlying chronic kidney disease as well as diabetes mellitus. She was hypotensive after her cardiac catheterization and is on pressor therapy.  Her hemoglobin has been relatively stable.  Medications: Outpatient medications: Prescriptions prior to admission  Medication Sig Dispense Refill Last Dose  . aspirin 81 MG tablet Take by mouth See admin instructions. Takes 3 times per week   10/26/2015 at Unknown time  . calcium carbonate (OS-CAL) 600 MG TABS tablet Take by mouth.   10/26/2015 at Unknown time  . clopidogrel (PLAVIX) 75 MG tablet Take 1 tablet (75 mg total) by mouth daily. 90 tablet 3 10/26/2015 at Unknown time  . cyanocobalamin (,VITAMIN B-12,) 1000 MCG/ML injection INJECT 1 ML IN THE MUSCLE ONCE MONTHLY 10 mL 0 10/26/2015 at Unknown time  . ferrous sulfate 325 (65 FE) MG tablet Take by mouth.   10/26/2015 at Unknown time  .  furosemide (LASIX) 20 MG tablet Take 1 tablet (20 mg total) by mouth daily. 30 tablet 3 10/26/2015 at Unknown time  . glipiZIDE (GLUCOTROL) 5 MG tablet Take 1 tablet (5 mg total) by mouth daily. 90 tablet 3 10/26/2015 at Unknown time  . hydrochlorothiazide (HYDRODIURIL) 25 MG tablet Take 1 tablet (25 mg total) by mouth daily. 90 tablet 3 10/26/2015 at Unknown time  . lisinopril (PRINIVIL,ZESTRIL) 20 MG tablet Take 1 tablet (20 mg total) by mouth daily. 90 tablet 3 10/26/2015 at Unknown time  . MULTIPLE VITAMIN PO Take by mouth.   10/26/2015 at Unknown time  . simvastatin (ZOCOR) 40 MG tablet Take 1 tablet (40 mg total) by mouth daily. 90 tablet 3 10/26/2015 at Unknown time  . sitaGLIPtin (JANUVIA) 100 MG tablet Take 1 tablet (100 mg total) by mouth daily. 90 tablet 3 10/26/2015 at Unknown time  . traMADol (ULTRAM) 50 MG tablet 1 to 2 tablets at bedtime 180 tablet 1 10/26/2015 at Unknown time  . traZODone (DESYREL) 100 MG tablet Take 1 tablet (100 mg total) by mouth at bedtime. 90 tablet 3 10/26/2015 at Unknown time  . glucose blood test strip TRUETEST TEST (In Vitro Strip)  1 Strip two times daily and as needed for 0 days  Quantity: 100;  Refills: 12   Ordered :04-Jun-2012  Janey Greaser ;  Started 04-Jun-2012 Active Comments: Medication taken as needed.   Taking  . glucose blood test strip Check sugar once daily. DX:E11.9 100 each 12 Taking  . ONETOUCH DELICA LANCETS 33G MISC Check sugar once daily. DX: E11.9 50 each 12 Taking    Current medications: Current Facility-Administered Medications  Medication Dose Route Frequency Provider Last Rate Last Dose  . 0.9 %  sodium chloride infusion   Intravenous Continuous Alwyn Pea, MD 50 mL/hr at 11/18/2015 1520    . [MAR Hold] acetaminophen (TYLENOL) tablet 650 mg  650 mg Oral Q4H PRN Dwayne D Callwood, MD   650 mg at November 18, 2015 1344  . DOPamine (INTROPIN) 3.2-5 MG/ML-% infusion           . [MAR Hold] DOPamine (INTROPIN) 800 mg in dextrose 5 % 250 mL (3.2 mg/mL)  infusion  0-20 mcg/kg/min Intravenous Titrated Dwayne D Callwood, MD      . Mitzi Hansen Hold] insulin aspart (novoLOG) injection 0-5 Units  0-5 Units Subcutaneous QHS Alwyn Pea, MD   4 Units at 11/13/2015 2204  . [MAR Hold] insulin aspart (novoLOG) injection 0-9 Units  0-9 Units Subcutaneous TID WC Alwyn Pea, MD   3 Units at 11-18-2015 1227  . midazolam (VERSED) injection    PRN Annice Needy, MD   1 mg at 18-Nov-2015 1554  . [MAR Hold] ondansetron (ZOFRAN) injection 4 mg  4 mg Intravenous Q6H PRN Dwayne D Callwood, MD      . Mitzi Hansen Hold] phenylephrine (NEO-SYNEPHRINE) 40 mg in dextrose 5 % 250 mL (0.16 mg/mL) infusion  0-400 mcg/min Intravenous Titrated Dwayne D Callwood, MD 75 mL/hr at Nov 18, 2015 0813 200 mcg/min at 11-18-15 0813  . [MAR Hold] sodium chloride flush (NS) 0.9 % injection 3 mL  3 mL Intravenous Q12H Dwayne D Callwood, MD   3 mL at November 18, 2015 0932  . [MAR Hold] sodium chloride flush (NS) 0.9 % injection 3 mL  3 mL Intravenous PRN Alwyn Pea, MD          Allergies: Allergies  Allergen Reactions  . Erythromycin       Past Medical History: Past Medical History  Diagnosis Date  . Heart murmur   . Diabetes mellitus without complication (HCC)   . Hypertension   . Hyperlipemia      Past Surgical History: Past Surgical History  Procedure Laterality Date  . Hernia repair  2005    umbilical  . Abdominal hysterectomy  2002    and BSO due to fibroids     Family History: Family History  Problem Relation Age of Onset  . Heart disease Mother   . Hypertension Mother   . Diabetes Mother   . Heart attack Mother   . Arthritis Father   . Heart disease Father   . Heart attack Father   . CAD Brother   . Ovarian cancer Maternal Aunt   . Breast cancer Maternal Aunt   . CAD Brother      Social History: Social History   Social History  . Marital Status: Widowed    Spouse Name: N/A  . Number of Children: N/A  . Years of Education: N/A   Occupational History  .  Not on file.   Social History Main Topics  . Smoking status: Former Smoker -- 2 years  . Smokeless tobacco: Not on file  . Alcohol Use: No  . Drug Use: No  .  Sexual Activity: Not on file   Other Topics Concern  . Not on file   Social History Narrative     Review of Systems: As per HPI  Vital Signs: Blood pressure 64/46, pulse 95, temperature 98.7 F (37.1 C), temperature source Oral, resp. rate 24, height  (1.6 m), weight 74.753 kg (164 lb 12.8 oz), SpO2 96 %.  Weight trends: Filed Weights   11/02/2015 1821 11/13/2015 2025 13-Nov-2015 0551  Weight: 79.379 kg (175 lb) 74.9 kg (165 lb 2 oz) 74.753 kg (164 lb 12.8 oz)    Physical Exam: General: NAD, resting in bed  Head: Normocephalic, atraumatic.  Eyes: Anicteric, EOMI  Nose: Mucous membranes moist, not inflammed, nonerythematous.  Throat: Oropharynx nonerythematous, no exudate appreciated.   Neck: Supple, trachea midline.  Lungs:  Normal respiratory effort. Clear to auscultation BL without crackles or wheezes.  Heart: S1S2 irregular at times  Abdomen:  BS normoactive. Soft, Nondistended, non-tender.  No masses or organomegaly.  Extremities: No pretibial edema.  Neurologic: A&O X3, Motor strength is 5/5 in the all 4 extremities  Skin: No visible rashes, scars.    Lab results: Basic Metabolic Panel:  Recent Labs Lab 10/26/2015 1941  NA 140  K 4.3  CL 101  CO2 30  GLUCOSE 284*  BUN 55*  CREATININE 2.37*  CALCIUM 7.9*    Liver Function Tests:  Recent Labs Lab 11/12/2015 1941  AST 13*  ALT 7*  ALKPHOS 42  BILITOT 0.7  PROT 5.4*  ALBUMIN 2.9*   No results for input(s): LIPASE, AMYLASE in the last 168 hours. No results for input(s): AMMONIA in the last 168 hours.  CBC:  Recent Labs Lab 10/25/2015 1643  November 13, 2015 0021 11-13-2015 0402 Nov 13, 2015 1253  WBC 6.6  --   --  10.0  --   HGB 9.9*  < > 8.6* 8.4* 9.9*  HCT 30.1*  --   --  26.1*  --   MCV 95.0  --   --  97.0  --   PLT 180  --   --  196  --   < >  = values in this interval not displayed.  Cardiac Enzymes: No results for input(s): CKTOTAL, CKMB, CKMBINDEX, TROPONINI in the last 168 hours.  BNP: Invalid input(s): POCBNP  CBG:  Recent Labs Lab 11/01/2015 2030 11-13-15 0750 11/13/2015 1155  GLUCAP 318* 218* 226*    Microbiology: Results for orders placed or performed during the hospital encounter of 10/22/2015  MRSA PCR Screening     Status: None   Collection Time: 11/12/2015  8:22 PM  Result Value Ref Range Status   MRSA by PCR NEGATIVE NEGATIVE Final    Comment:        The GeneXpert MRSA Assay (FDA approved for NASAL specimens only), is one component of a comprehensive MRSA colonization surveillance program. It is not intended to diagnose MRSA infection nor to guide or monitor treatment for MRSA infections.     Coagulation Studies:  Recent Labs  11/02/2015 1941  LABPROT 15.2*  INR 1.18    Urinalysis: No results for input(s): COLORURINE, LABSPEC, PHURINE, GLUCOSEU, HGBUR, BILIRUBINUR, KETONESUR, PROTEINUR, UROBILINOGEN, NITRITE, LEUKOCYTESUR in the last 72 hours.  Invalid input(s): APPERANCEUR    Imaging: Ct Abdomen Pelvis Wo Contrast  11/13/2015  CLINICAL DATA:  82 year old female with right-sided leg swelling and bruising following 8 cardiac catheterization today. Evaluate for potential retroperitoneal hemorrhage. EXAM: CT ABDOMEN AND PELVIS WITHOUT CONTRAST TECHNIQUE: Multidetector CT imaging of the abdomen and pelvis was performed  following the standard protocol without IV contrast. COMPARISON:  No priors. FINDINGS: Lower chest: Trace bilateral pleural effusions. Heart size is mildly enlarged with left ventricular dilatation. Severe calcifications of the aortic valve and mitral valve/annulus. Atherosclerotic calcifications are noted within the left circumflex and right coronary arteries. Hepatobiliary: No definite cystic or solid hepatic lesions are identified on today's noncontrast CT examination. Tiny calcified  granuloma in the central aspect of the liver. Small calcified gallstones are noted dependently in the gallbladder. No current findings to suggest an acute cholecystitis at this time. Pancreas: No pancreatic mass or peripancreatic inflammatory changes on today's noncontrast CT examination. Spleen: Unremarkable. Adrenals/Urinary Tract: Kidneys are mildly atrophic bilaterally. Mild nodular thickening of the adrenal glands bilaterally appears relatively symmetric. No hydroureteronephrosis. Urinary bladder is normal in appearance. Stomach/Bowel: Unenhanced appearance of the stomach is normal. No pathologic dilatation of small bowel or colon. Numerous colonic diverticulae are noted, particularly throughout the descending colon and sigmoid colon, without surrounding inflammatory changes to suggest an acute diverticulitis at this time. Normal appendix. Vascular/Lymphatic: Atherosclerotic calcifications throughout the abdominal and pelvic vasculature, without definite aneurysm. No lymphadenopathy noted in the abdomen or pelvis. Reproductive: Status post hysterectomy. Ovaries are not confidently identified may be surgically absent or atrophic. Other: No high attenuation fluid collection in the retroperitoneum or within the peritoneal cavity. However, there is marked expansion and high attenuation throughout the anterior compartment of the visualized upper thigh immediately superficial to the right femoral arteries, presumably a large amount of intramuscular hemorrhage. Some intramuscular hemorrhage is also noted in the short adductors. This results in expansion of this musculature up to 9.8 x 7.9 cm (image 98 of series 2), which is markedly asymmetric with the contralateral side. Postoperative changes of mesh repair for ventral hernia or umbilical hernia are noted. Notably, there is recurrent herniation adjacent to the left side of the mesh, containing a short segment of the mid transverse colon. Musculoskeletal: There are no  aggressive appearing lytic or blastic lesions noted in the visualized portions of the skeleton. IMPRESSION: 1. Large amount of intramuscular hemorrhage within the anterior compartment of the upper right thigh and short adductors, as above. 2. No retroperitoneal hemorrhage identified at this time. 3. Status post mesh repair for ventral or umbilical hernia, with evidence of recurrent herniation along the left lateral aspect of the mesh, containing a short segment of mid transverse colon. No evidence of associated bowel incarceration or obstruction at this time. 4. Severe colonic diverticulosis without evidence of acute diverticulitis at this time. 5. Trace bilateral pleural effusions. 6. Mild cardiomegaly with left ventricular dilatation. 7. Atherosclerosis, including at least 2 vessel coronary artery disease. 8. There are calcifications of the aortic valve and mitral valve/annulus. Echocardiographic correlation for evaluation of potential valvular dysfunction may be warranted if clinically indicated. 9. Additional incidental findings, as above. These results will be called to the ordering clinician or representative by the Radiologist Assistant, and communication documented in the PACS or zVision Dashboard. Electronically Signed   By: Trudie Reed M.D.   On: 10/22/2015 16:02   [place] Lower Ext Art Right Ltd  11/20/2015  CLINICAL DATA:  Pseudoaneurysm EXAM: UNILATERAL RIGHT LOWER EXTREMITY ARTERIAL DUPLEX SCAN TECHNIQUE: Gray-scale sonography as well as color Doppler and duplex ultrasound was performed to evaluate the arteries of the lower extremity. COMPARISON:  CT 10/20/2015 FINDINGS: There is a deep subcutaneous hematoma 15.6 x 2.7 x 6.8 cm. There is a pseudoaneurysm identified in the deep component of the hematoma. There is a neck from  the pseudoaneurysm to the common femoral artery. No evidence of AV fistula. IMPRESSION: 1. Pseudoaneurysm from the common femoral artery with large overlying hematoma.  Electronically Signed   By: Corlis Leak M.D.   On: 11-13-2015 09:44   Dg Chest Port 1 View  Nov 13, 2015  CLINICAL DATA:  Status post PICC line placement. EXAM: PORTABLE CHEST 1 VIEW COMPARISON:  10/12/2015 FINDINGS: A right-sided PICC has been placed and terminates over the mid to lower SVC. The cardiac silhouette remains mildly enlarged. Thoracic aortic calcification is noted. The patient has taken a shallower inspiration than on the prior study with accentuation of the central pulmonary vasculature. Mild bibasilar atelectasis is noted. No sizable pleural effusion or pneumothorax is identified. IMPRESSION: 1. Interval PICC placement terminating over the mid to lower SVC. 2. Mild bibasilar atelectasis. Electronically Signed   By: Sebastian Ache M.D.   On: 2015-11-13 15:12      Assessment & Plan: Pt is a 82 y.o. female  with a PMHx of diabetes mellitus type 2, hypertension, hyperlipidemia, atrial fibrillation, carotid stenosis, CVA, B12 deficiency who was admitted to [facility] on 10/26/2015 status post cardiac catheterization after she developed a right thigh hematoma.  1. Acute renal failure/chronic kidney disease stage III. Acute renal failure secondary to contrast-induced nephropathy. Chronic kidney disease likely as a result of age, hypertension, and diabetes mellitus. -  As above she appears to have contrast-induced nephropathy. She will be receiving additional contrast this afternoon to address her pseudoaneurysm in her right lower extremity. Her renal function is likely to worsen and this was explained to the patient.  For now we recommend continued hydration with 0.9 normal saline at at least 75 cc per hour. However her renal insufficiency may progress and she mandible requiring temporary dialysis. This was explained to the patient in depth today. She is open to this if it should come to it. Continue to monitor renal function closely.  We will also start the patient on oral Mucomyst.  2. Hypotension. They  will be important to maintain a map of 65 or greater. The patient is maintained on pressor therapy for now.  3. Anemia of chronic kidney disease. Hemoglobin currently 8.9. She is at risk for worsening anemia however. No urgent indication for Procrit however we may need to consider this as an outpatient. Recommend continued monitoring of CBC while she is admitted.  4. Thanks for consultation. Further plan as patient progresses.

## 2015-10-28 NOTE — Progress Notes (Signed)
Pseudoaneurysm seen on [place] with enlargement of the hematoma. With low BP and ongoing bleeding, proceed with angiogram for further evaluation and possibly treatment. Risks and benefits discussed

## 2015-10-28 NOTE — Op Note (Signed)
[facility] Percutaneous Study/Intervention Procedural Note   Date of Surgery: 11/11/2015  Surgeon(s):DEW,JASON   Assistants:none  Pre-operative Diagnosis: Pseudoaneurysm right femoral artery with active bleeding, acute blood loss anemia, and hypotension  Post-operative diagnosis: Same as above with the pseudoaneurysm found to be on the right superficial femoral artery  Procedure(s) Performed: 1. Ultrasound guidance for vascular access left femoral artery 2. Catheter placement into right superficial femoral artery from left femoral approach 3. Aortogram and selective right lower extremity angiogram 4. Covered stent placement to the right superficial femoral artery to exclude the pseudoaneurysm with 6 mm diameter x 5 cm length Viabahn stent 5. StarClose closure device left femoral artery  EBL: minimal   Contrast: 40 cc  Fluro Time: 3.6 minutes  Moderate Conscious Sedation Time: approximately 30 minutes using 1 mg of Versed and 25 mcg of Fentanyl  Indications: Patient is a 82 y.o.female with hypotensive shock and acute blood loss anemia from a right femoral pseudoaneruysm. The patient has noninvasive study showing a pseudoaneurysm with a wide neck. The patient is brought in for angiography for further evaluation and potential treatment. Risks and benefits are discussed and informed consent is obtained  Procedure: The patient was identified and appropriate procedural time out was performed. The patient was then placed supine on the table and prepped and draped in the usual sterile fashion.Moderate conscious sedation was administered during a face to face encounter with the patient throughout the procedure with my supervision of the RN administering medicines and monitoring the patient's vital signs, pulse oximetry, telemetry and mental status throughout from the start of the  procedure until the patient was taken to the recovery room. Ultrasound was used to evaluate the left common femoral artery. It was patent . A digital ultrasound image was acquired. A Seldinger needle was used to access the left common femoral artery under direct ultrasound guidance and a permanent image was performed. A 0.035 J wire was advanced without resistance and a 5Fr sheath was placed. Pigtail catheter was placed into the aorta and an AP aortogram was performed. This demonstrated normal renal arteries and normal aorta and iliac segments without significant stenosis. I then crossed the aortic bifurcation and advanced to the right femoral head. Selective right lower extremity angiogram was then performed. To help differentiate between the profunda branches and the SFA, the catheter was parked in the proximal SFA. This demonstrated active bleeding from a pseudoaneurysm in the superficial femoral artery about 5 cm beyond the origin. The 6 [place] Ansell sheath was then placed over the [employer] wire. I then the advantage wire to advance into the distal SFA and exchange for the V18 wire.  At this point, selective magnified imaging was done.  I then selected a 6 mm diameter x 5 cm length Viabahn stent about 2-3 cm beyond the origin of the SFA centered on the pseudoaneurysm.  I then post-dilated the stent with a 5 mm diameter balloon.  Completion angiogram showed no further bleeding from the pseudoaneurysm with successful exclusion and a widely patent stent. I elected to terminate the procedure. The sheath was removed and StarClose closure device was deployed in the left femoral artery with excellent hemostatic result. The patient was taken to the recovery room in stable condition having tolerated the procedure well.  Findings:  Aortogram: normal aorta and iliac segments.  No renal artery stenosis Right Lower Extremity: Active bleeding from a pseudoaneurysm in the  superficial femoral artery about 5 cm beyond the origin successfully excluded  with a covered stent   Disposition: Patient was taken to the recovery room in stable condition having tolerated the procedure well.  Complications: None  DEW,JASON 10/21/2015 4:31 PM

## 2015-10-28 NOTE — Progress Notes (Signed)
Subjective:   patient states she feels reasonably well still fairly weak denies any significant pain slip reasonably well may have had some vague right groin discomfort earlier no fever chills or sweats no chest pain off nausea vomiting denies back pain.  Objective:  Vital Signs in the last 24 hours: Temp:  [97.6 F (36.4 C)-98.7 F (37.1 C)] 98.7 F (37.1 C) (02/10 1300) Pulse Rate:  [93-107] 95 (02/10 1530) Resp:  [17-41] 24 (02/10 1530) BP: (62-157)/(21-119) 64/46 mmHg (02/10 1530) SpO2:  [86 %-100 %] 96 % (02/10 1530) Weight:  [74.753 kg (164 lb 12.8 oz)-79.379 kg (175 lb)] 74.753 kg (164 lb 12.8 oz) (02/10 0551)  Intake/Output from previous day: 02/09 0701 - 02/10 0700 In: 1577.6 [I.V.:1077.6; IV Piggyback:500] Out: 500 [Urine:500] Intake/Output from this shift:    Physical Exam: General appearance: alert, cooperative and appears stated age Neck: no adenopathy, no carotid bruit, no JVD, supple, symmetrical, trachea midline and thyroid not enlarged, symmetric, no tenderness/mass/nodules Lungs: clear to auscultation bilaterally Heart: irregularly irregular rhythm, S3 present and systolic murmur: systolic ejection 2/6, crescendo at 2nd left intercostal space Abdomen: soft, non-tender; bowel sounds normal; no masses,  no organomegaly Extremities: extremities normal, atraumatic, no cyanosis or edema Pulses: 2+ and symmetric Skin: Skin color, texture, turgor normal. No rashes or lesions Neurologic: Alert and oriented X 3, normal strength and tone. Normal symmetric reflexes. Normal coordination and gait  Lab Results:  Recent Labs  11/05/2015 1643  11/10/2015 0402 11/04/2015 1253  WBC 6.6  --  10.0  --   HGB 9.9*  < > 8.4* 9.9*  PLT 180  --  196  --   < > = values in this interval not displayed.  Recent Labs  11/14/2015 1941  NA 140  K 4.3  CL 101  CO2 30  GLUCOSE 284*  BUN 55*  CREATININE 2.37*   No results for input(s): TROPONINI in the last 72 hours.  Invalid  input(s): CK, MB Hepatic Function Panel  Recent Labs  11/13/2015 1941  PROT 5.4*  ALBUMIN 2.9*  AST 13*  ALT 7*  ALKPHOS 42  BILITOT 0.7   No results for input(s): CHOL in the last 72 hours. No results for input(s): PROTIME in the last 72 hours.  Imaging: Imaging results have been reviewed  Cardiac Studies:  Assessment/Plan:  Arrhythmia Atrial Fibrillation Cardiomyopathy CHF Edema Hypotension/Shock Shortness of Breath Valvular Disease Vascular Disease   hematoma right groin  anemia   Hypotension   tachycardia  aortic stenosis  cardiomyopathy  status post Cath  diabetes  acute on chronic renal insufficiency . PLAN  agree with ICU level care  continue pressors for hypotension  recommend transfusion of 1 unit for anemia and bleeding in the right groin  recommend fluid resuscitation with bolus  console vascular for evaluation of right groin to rule out pseudoaneurysm  ultrasound of right groin for evaluation of bleeding and pseudoaneurysm and hematoma  not a good anticoagulation candidate because of right groin hematoma possible pseudoaneurysm  consider local DVT prophylaxis support stockings TENs  stockings  nephrology console for acute on chronic renal insufficiency  diabetes sliding scale  advanced diet 2000 calorie ADA low-salt  consider switching from Neo-Synephrine 2 dopamine for hypertension  will eventually need heart failure treatment for severely depressed left ventricular function  consider evaluation for aortic stenosis will  Consider referral to TAVR   follow up CBCs and chemistries  LOS: 1 day    Linda Jones D. 10/19/2015, 3:51 PM

## 2015-10-28 NOTE — Progress Notes (Signed)
Paged Dr. Juliann Pares concerning pt's blood pressure.  Pt now on of neosynephrine and bp remains low.  Per MD, get a type and screen and crossmatch 1 unit of prbc.  Notified Dr. Sheryle Hail to consent pt for blood.

## 2015-10-28 NOTE — Progress Notes (Signed)
Pt came to unit hypotensive but alert and oriented.  Pt given one more 500cc bolus with really no improvement.  Neosynephrine was ordered per Dr. Juliann Pares.  Drip presently at .  Pt now has a map of 70 and Hr of 100.

## 2015-10-28 NOTE — Progress Notes (Signed)
Pt transferred to special procedures at 1530 for angiography. Pt remains on neosynephrine drip at . Per Dr. Juliann Pares wait to start dopamine drip and wean when she come back from procedure

## 2015-10-28 NOTE — Progress Notes (Signed)
Mrs. Consalvo is alert and oriented. Mood pleasant. Pt is now on dopamine drip and neosynephrine for hypotension. HR increased to 125. Dr.Callwood aware. Physician ordered to drop dopamine drip to 2 mcg if HR become greater than 140. Pseudoaneurysm found today during ultrasound of right groin. Angiograph today with stent placed by Dr. Wyn Quaker.

## 2015-10-29 DIAGNOSIS — E872 Acidosis: Secondary | ICD-10-CM

## 2015-10-29 DIAGNOSIS — R0600 Dyspnea, unspecified: Secondary | ICD-10-CM

## 2015-10-29 DIAGNOSIS — I959 Hypotension, unspecified: Secondary | ICD-10-CM

## 2015-10-29 LAB — BASIC METABOLIC PANEL
ANION GAP: 12 (ref 5–15)
ANION GAP: 10 (ref 5–15)
ANION GAP: 7 (ref 5–15)
BUN: 56 mg/dL — ABNORMAL HIGH (ref 6–20)
BUN: 58 mg/dL — AB (ref 6–20)
BUN: 43 mg/dL — ABNORMAL HIGH (ref 6–20)
CALCIUM: 7.5 mg/dL — AB (ref 8.9–10.3)
CALCIUM: 7.2 mg/dL — AB (ref 8.9–10.3)
CHLORIDE: 96 mmol/L — AB (ref 101–111)
CO2: 24 mmol/L (ref 22–32)
CO2: 26 mmol/L (ref 22–32)
CO2: 24 mmol/L (ref 22–32)
Calcium: 7.5 mg/dL — ABNORMAL LOW (ref 8.9–10.3)
Chloride: 96 mmol/L — ABNORMAL LOW (ref 101–111)
Chloride: 101 mmol/L (ref 101–111)
Creatinine, Ser: 3.67 mg/dL — ABNORMAL HIGH (ref 0.44–1.00)
Creatinine, Ser: 3.75 mg/dL — ABNORMAL HIGH (ref 0.44–1.00)
Creatinine, Ser: 2.88 mg/dL — ABNORMAL HIGH (ref 0.44–1.00)
GFR calc Af Amer: 12 mL/min — ABNORMAL LOW (ref >60)
GFR, EST AFRICAN AMERICAN: 12 mL/min — AB (ref >60)
GFR, EST AFRICAN AMERICAN: 17 mL/min — AB (ref >60)
GFR, EST NON AFRICAN AMERICAN: 11 mL/min — AB (ref >60)
GFR, EST NON AFRICAN AMERICAN: 10 mL/min — AB (ref >60)
GFR, EST NON AFRICAN AMERICAN: 14 mL/min — AB (ref >60)
GLUCOSE: 283 mg/dL — AB (ref 65–99)
Glucose, Bld: 291 mg/dL — ABNORMAL HIGH (ref 65–99)
Glucose, Bld: 228 mg/dL — ABNORMAL HIGH (ref 65–99)
POTASSIUM: 4.2 mmol/L (ref 3.5–5.1)
POTASSIUM: 4 mmol/L (ref 3.5–5.1)
Potassium: 4.2 mmol/L (ref 3.5–5.1)
Sodium: 132 mmol/L — ABNORMAL LOW (ref 135–145)
Sodium: 132 mmol/L — ABNORMAL LOW (ref 135–145)
Sodium: 132 mmol/L — ABNORMAL LOW (ref 135–145)

## 2015-10-29 LAB — RENAL FUNCTION PANEL
Albumin: 2.6 g/dL — ABNORMAL LOW (ref 3.5–5.0)
Albumin: 2.2 g/dL — ABNORMAL LOW (ref 3.5–5.0)
Albumin: 2.4 g/dL — ABNORMAL LOW (ref 3.5–5.0)
Anion gap: 9 (ref 5–15)
Anion gap: 7 (ref 5–15)
Anion gap: 7 (ref 5–15)
BUN: 56 mg/dL — ABNORMAL HIGH (ref 6–20)
BUN: 43 mg/dL — ABNORMAL HIGH (ref 6–20)
BUN: 36 mg/dL — AB (ref 6–20)
CHLORIDE: 102 mmol/L (ref 101–111)
CO2: 25 mmol/L (ref 22–32)
CO2: 25 mmol/L (ref 22–32)
CO2: 25 mmol/L (ref 22–32)
Calcium: 7.4 mg/dL — ABNORMAL LOW (ref 8.9–10.3)
Calcium: 7.2 mg/dL — ABNORMAL LOW (ref 8.9–10.3)
Calcium: 7.6 mg/dL — ABNORMAL LOW (ref 8.9–10.3)
Chloride: 96 mmol/L — ABNORMAL LOW (ref 101–111)
Chloride: 100 mmol/L — ABNORMAL LOW (ref 101–111)
Creatinine, Ser: 3.69 mg/dL — ABNORMAL HIGH (ref 0.44–1.00)
Creatinine, Ser: 2.81 mg/dL — ABNORMAL HIGH (ref 0.44–1.00)
Creatinine, Ser: 2.57 mg/dL — ABNORMAL HIGH (ref 0.44–1.00)
GFR calc Af Amer: 12 mL/min — ABNORMAL LOW
GFR calc Af Amer: 17 mL/min — ABNORMAL LOW
GFR calc Af Amer: 19 mL/min — ABNORMAL LOW (ref >60)
GFR calc non Af Amer: 11 mL/min — ABNORMAL LOW
GFR calc non Af Amer: 15 mL/min — ABNORMAL LOW
GFR, EST NON AFRICAN AMERICAN: 16 mL/min — AB (ref >60)
Glucose, Bld: 280 mg/dL — ABNORMAL HIGH (ref 65–99)
Glucose, Bld: 226 mg/dL — ABNORMAL HIGH (ref 65–99)
Glucose, Bld: 128 mg/dL — ABNORMAL HIGH (ref 65–99)
POTASSIUM: 4.2 mmol/L (ref 3.5–5.1)
Phosphorus: 5.4 mg/dL — ABNORMAL HIGH (ref 2.5–4.6)
Phosphorus: 3.9 mg/dL (ref 2.5–4.6)
Phosphorus: 3.5 mg/dL (ref 2.5–4.6)
Potassium: 4.2 mmol/L (ref 3.5–5.1)
Potassium: 4 mmol/L (ref 3.5–5.1)
Sodium: 130 mmol/L — ABNORMAL LOW (ref 135–145)
Sodium: 132 mmol/L — ABNORMAL LOW (ref 135–145)
Sodium: 134 mmol/L — ABNORMAL LOW (ref 135–145)

## 2015-10-29 LAB — CBC
HCT: 29.9 % — ABNORMAL LOW (ref 35.0–47.0)
HEMOGLOBIN: 9.6 g/dL — AB (ref 12.0–16.0)
MCH: 29.8 pg (ref 26.0–34.0)
MCHC: 32.1 g/dL (ref 32.0–36.0)
MCV: 93 fL (ref 80.0–100.0)
PLATELETS: 118 10*3/uL — AB (ref 150–440)
RBC: 3.22 MIL/uL — AB (ref 3.80–5.20)
RDW: 20.4 % — ABNORMAL HIGH (ref 11.5–14.5)
WBC: 11.1 10*3/uL — AB (ref 3.6–11.0)

## 2015-10-29 LAB — BLOOD GAS, ARTERIAL
ALLENS TEST (PASS/FAIL): POSITIVE — AB
Acid-base deficit: 3 mmol/L — ABNORMAL HIGH (ref 0.0–2.0)
Bicarbonate: 26 mEq/L (ref 21.0–28.0)
FIO2: 0.32
O2 SAT: 88.7 %
PATIENT TEMPERATURE: 37
PCO2 ART: 65 mmHg — AB (ref 32.0–48.0)
pH, Arterial: 7.21 — ABNORMAL LOW (ref 7.350–7.450)
pO2, Arterial: 68 mmHg — ABNORMAL LOW (ref 83.0–108.0)

## 2015-10-29 LAB — TYPE AND SCREEN
ABO/RH(D): O POS
ANTIBODY SCREEN: NEGATIVE
UNIT DIVISION: 0
Unit division: 0
Unit division: 0

## 2015-10-29 LAB — GLUCOSE, CAPILLARY
GLUCOSE-CAPILLARY: 273 mg/dL — AB (ref 65–99)
GLUCOSE-CAPILLARY: 213 mg/dL — AB (ref 65–99)
GLUCOSE-CAPILLARY: 146 mg/dL — AB (ref 65–99)
GLUCOSE-CAPILLARY: 127 mg/dL — AB (ref 65–99)
Glucose-Capillary: 254 mg/dL — ABNORMAL HIGH (ref 65–99)

## 2015-10-29 LAB — HEMOGLOBIN AND HEMATOCRIT, BLOOD
HCT: 30 % — ABNORMAL LOW (ref 35.0–47.0)
Hemoglobin: 9.8 g/dL — ABNORMAL LOW (ref 12.0–16.0)

## 2015-10-29 MED ORDER — SODIUM CHLORIDE 0.9 % IV SOLN
0.0300 [IU]/min | INTRAVENOUS | Status: DC
  Filled 2015-10-29: qty 2

## 2015-10-29 MED ORDER — ALTEPLASE 2 MG IJ SOLR
2.0000 mg | Freq: Once | INTRAMUSCULAR | Status: AC | PRN
  Administered 2015-10-29: 2 mg
  Filled 2015-10-29: qty 2

## 2015-10-29 MED ORDER — DEXTROSE 5 % IV SOLN
12.0000 ug/min | INTRAVENOUS | Status: DC
  Administered 2015-10-29: 25 ug/min via INTRAVENOUS
  Administered 2015-10-29: 10 ug/min via INTRAVENOUS
  Filled 2015-10-29 (×3): qty 4

## 2015-10-29 MED ORDER — ALTEPLASE 2 MG IJ SOLR
2.0000 mg | Freq: Once | INTRAMUSCULAR | Status: AC
  Administered 2015-10-29: 2 mg
  Filled 2015-10-29: qty 2

## 2015-10-29 MED ORDER — PUREFLOW DIALYSIS SOLUTION
INTRAVENOUS | Status: DC
  Administered 2015-10-29 – 2015-10-30 (×4): via INTRAVENOUS_CENTRAL
  Administered 2015-10-31: 3 via INTRAVENOUS_CENTRAL

## 2015-10-29 MED ORDER — IPRATROPIUM-ALBUTEROL 0.5-2.5 (3) MG/3ML IN SOLN
3.0000 mL | RESPIRATORY_TRACT | Status: DC
  Administered 2015-10-29 – 2015-10-31 (×15): 3 mL via RESPIRATORY_TRACT
  Filled 2015-10-29 (×15): qty 3

## 2015-10-29 MED ORDER — NOREPINEPHRINE BITARTRATE 1 MG/ML IV SOLN
2.0000 ug/min | INTRAVENOUS | Status: DC
  Administered 2015-10-29: 50 ug/min via INTRAVENOUS
  Filled 2015-10-29 (×2): qty 4

## 2015-10-29 MED ORDER — DEXTROSE 5 % IV SOLN
4.0000 ug/min | INTRAVENOUS | Status: DC
  Administered 2015-10-29: 45 ug/min via INTRAVENOUS
  Administered 2015-10-30: 28 ug/min via INTRAVENOUS
  Administered 2015-10-30: 20 ug/min via INTRAVENOUS
  Administered 2015-10-31: 30 ug/min via INTRAVENOUS
  Administered 2015-10-31: 47 ug/min via INTRAVENOUS
  Filled 2015-10-29 (×7): qty 16

## 2015-10-29 MED ORDER — STERILE WATER FOR INJECTION IJ SOLN
INTRAMUSCULAR | Status: AC
  Administered 2015-10-29: 10 mL
  Filled 2015-10-29: qty 10

## 2015-10-29 MED ORDER — VASOPRESSIN 20 UNIT/ML IV SOLN
0.0300 [IU]/min | INTRAVENOUS | Status: DC
  Administered 2015-10-29 – 2015-10-31 (×3): 0.03 [IU]/min via INTRAVENOUS
  Filled 2015-10-29 (×2): qty 2

## 2015-10-29 MED ORDER — BUDESONIDE 0.5 MG/2ML IN SUSP
0.5000 mg | Freq: Two times a day (BID) | RESPIRATORY_TRACT | Status: DC
  Administered 2015-10-29 – 2015-10-31 (×5): 0.5 mg via RESPIRATORY_TRACT
  Filled 2015-10-29 (×6): qty 2

## 2015-10-29 MED ORDER — HEPARIN SODIUM (PORCINE) 1000 UNIT/ML DIALYSIS
1000.0000 [IU] | INTRAMUSCULAR | Status: DC | PRN
  Administered 2015-10-30 – 2015-10-31 (×2): 1000 [IU] via INTRAVENOUS_CENTRAL
  Filled 2015-10-29 (×2): qty 6

## 2015-10-29 NOTE — Progress Notes (Signed)
Pt has been hypotensive with low MAP on and off this shift. Had to titrate dopamine and neo gtt up and down this shift. Pt had the third unit of prbcs last evening. No s/s adverse reaction. Low urinary output this shift. Bladder scan less than 60 ml with total output of 100 ml.

## 2015-10-29 NOTE — Progress Notes (Signed)
CRRT restarted and pressures are within defined limits. Patient tolerating well. Titrating levophed as patients blood pressure tolerates, per DR Kasa MAP of 60 is our goal. BMP every 4 hours, will obtain one at 19:00.

## 2015-10-29 NOTE — Progress Notes (Signed)
Arterial line clotted off.  CRRT stopped. Was not able to do rinse back.  Paged and spoke with Dr. Cherylann Ratel.  Cathflo ordered and instilled.  Will resume CRRT when line is patent.

## 2015-10-29 NOTE — Progress Notes (Signed)
Spoke With Dr Belia Heman patient is maxed out on 50 mcg of levophed. Per Dr Belia Heman add Vasopressin and concentrate levophed. Goal Map of 60. Urine output for the day so far has been 10 ml.

## 2015-10-29 NOTE — Progress Notes (Signed)
Spoke with Dr Cherylann Ratel regarding patient not having any urine output. Bladder scanned patient has 45 ml. Per Dr Cherylann Ratel continue to monitor at this point. No additional orders at this time.

## 2015-10-29 NOTE — Progress Notes (Signed)
Per PA Maggie max dose on levo . Requested vasopressin but at this time will continue to monitor.

## 2015-10-29 NOTE — Progress Notes (Signed)
[facility]  SUBJECTIVE: Pt remains hypotensive requiring levophed for support. SOB with some wheezing. No chest pain. Alert. Appreciate critical care medicine, nephrology and vascular surgery assistance.    Filed Vitals:   10/29/15 0400 10/29/15 0500 10/29/15 0600 10/29/15 0700  BP: 96/54 100/29 95/67 94/44   Pulse: 125 125 125 127  Temp:      TempSrc:      Resp: Height:      Weight:  82.192 kg (181 lb 3.2 oz)    SpO2: 93% 94% 91% 93%    Intake/Output Summary (Last 24 hours) at 10/29/15 1021 Last data filed at 10/29/15 0600  Gross per 24 hour  Intake 4076.99 ml  Output    250 ml  Net 3826.99 ml    LABS: Basic Metabolic Panel:  Recent Labs  65/78/46 1941  NA 140  K 4.3  CL 101  CO2 30  GLUCOSE 284*  BUN 55*  CREATININE 2.37*  CALCIUM 7.9*   Liver Function Tests:  Recent Labs  11/11/2015 1941  AST 13*  ALT 7*  ALKPHOS 42  BILITOT 0.7  PROT 5.4*  ALBUMIN 2.9*   No results for input(s): LIPASE, AMYLASE in the last 72 hours. CBC:  Recent Labs  11/02/2015 0402 11/09/2015 1253 10/29/15 0926  WBC 10.0  --  11.1*  HGB 8.4* 9.9* 9.6*  HCT 26.1*  --  29.9*  MCV 97.0  --  93.0  PLT 196  --  118*   Cardiac Enzymes: No results for input(s): CKTOTAL, CKMB, CKMBINDEX, TROPONINI in the last 72 hours. BNP: Invalid input(s): POCBNP D-Dimer: No results for input(s): DDIMER in the last 72 hours. Hemoglobin A1C: No results for input(s): HGBA1C in the last 72 hours. Fasting Lipid Panel: No results for input(s): CHOL, HDL, LDLCALC, TRIG, CHOLHDL, LDLDIRECT in the last 72 hours. Thyroid Function Tests:  Recent Labs  November 11, 2015 1941  TSH 0.800   Anemia Panel: No results for input(s): VITAMINB12, FOLATE, FERRITIN, TIBC, IRON, RETICCTPCT in the last 72 hours.   Physical Exam: Blood pressure 94/44, pulse 127, temperature 98.6 F (37 C), temperature source Oral, resp. rate 29, height  (1.6 m), weight 82.192 kg  (181 lb 3.2 oz), SpO2 93 %.    General appearance: alert and cooperative Head: Normocephalic, without obvious abnormality, atraumatic Resp: rhonchi bilaterally and wheezes bilaterally Cardio: tachycardic GI: soft, non-tender; bowel sounds normal; no masses,  no organomegaly Extremities: edema 2+ Pulses: palpable Neurologic: Grossly normal  TELEMETRY: Reviewed telemetry pt in tachycardia:  ASSESSMENT AND PLAN:  Active Problems:    Aortic stenosis-has critical aortic stenosis with ava 0.6 cm2 documented by cardiac cath. . Medical management for now.   Cad-50% mid lad, 30% rca, 25% left circ. -medical management for now.   Cardiomyopathy-ef 25-30%  Acute on chronic renal insuffiency-will likely need CRRT today to manange volume. Poor urine output. Appreciate nephrology assistance.  Hypotension-secondary to valvular heart disease, cardiomyopathy and recent hemodynamically significant bleed. Will contiue with levphed for now. FOllow hgb.   Tachycardia-multi factoral etiology including hypotension, pressors, cardiomyopathy. Will attempt to manage precipitating etiology. Not candidate for beta bockers or calcium channel blockers at present.   Right femoral pseudoaneurysm-s/p stent. Appreciate vascular surgery assistance. No further bleeding.    Dalia Heading., MD, [facility] 10/29/2015 10:21 AM

## 2015-10-29 NOTE — Progress Notes (Signed)
Spoke with Dr Belia Heman regarding patients BMP- Critical values. Will repeat test and send to lab

## 2015-10-29 NOTE — Progress Notes (Signed)
Orders entered for activase. Patients venous pressures high. No blood return present when flushing and pulling back venous line. Will dwell for 2 hours and restart.

## 2015-10-29 NOTE — Progress Notes (Signed)
[facility]  ROUNDING NOTE   Subjective:  Patient seen at bedside. Had pseudoaneurysm repair with a stent yesterday by vascular surgery. Patient only received 40 cc of contrast. However over the past 24 hours she is only made 250 cc of urine.  she has also been hypotensive and tachycardic.   Objective:  Vital signs in last 24 hours:  Temp:  [97.5 F (36.4 C)-98.7 F (37.1 C)] 98.6 F (37 C) (02/11 0200) Pulse Rate:  [95-127] 127 (02/11 0700) Resp:  [17-41] 29 (02/11 0700) BP: (54-109)/(20-69) 94/44 mmHg (02/11 0700) SpO2:  [86 %-97 %] 93 % (02/11 0700) Weight:  [82.192 kg (181 lb 3.2 oz)] 82.192 kg (181 lb 3.2 oz) (02/11 0500)  Weight change: 9.163 kg (20 lb 3.2 oz) Filed Weights   October 28, 2015 2025 10/21/2015 0551 10/29/15 0500  Weight: 74.9 kg (165 lb 2 oz) 74.753 kg (164 lb 12.8 oz) 82.192 kg (181 lb 3.2 oz)    Intake/Output: I/O last 3 completed shifts: In: 5654.6 [I.V.:4767.6; Blood:387; IV Piggyback:500] Out: 750 [Urine:750]   Intake/Output this shift:     Physical Exam: General: NAD, resting in bed  Head: Normocephalic, atraumatic. Moist oral mucosal membranes  Eyes: Anicteric  Neck: Supple, trachea midline  Lungs:  Clear to auscultation normal effort  Heart: S1S2 tachycardic  Abdomen:  Soft, nontender, BS present  Extremities: trace peripheral edema.  Neurologic: Nonfocal, moving all four extremities  Skin: No lesions       Basic Metabolic Panel:  Recent Labs Lab October 28, 2015 1941  NA 140  K 4.3  CL 101  CO2 30  GLUCOSE 284*  BUN 55*  CREATININE 2.37*  CALCIUM 7.9*    Liver Function Tests:  Recent Labs Lab 28-Oct-2015 1941  AST 13*  ALT 7*  ALKPHOS 42  BILITOT 0.7  PROT 5.4*  ALBUMIN 2.9*   No results for input(s): LIPASE, AMYLASE in the last 168 hours. No results for input(s): AMMONIA in the last 168 hours.  CBC:  Recent Labs Lab 28-Oct-2015 1643 11/13/2015 1941 11/03/2015 0021 10/30/2015 0402 11/03/2015 1253  WBC 6.6  --   --   10.0  --   HGB 9.9* 9.5* 8.6* 8.4* 9.9*  HCT 30.1*  --   --  26.1*  --   MCV 95.0  --   --  97.0  --   PLT 180  --   --  196  --     Cardiac Enzymes: No results for input(s): CKTOTAL, CKMB, CKMBINDEX, TROPONINI in the last 168 hours.  BNP: Invalid input(s): POCBNP  CBG:  Recent Labs Lab 11/03/2015 2030 10/20/2015 0750 11/14/2015 1155 11/15/2015 1754 11/15/2015 2115  GLUCAP 318* 218* 226* 214* 270*    Microbiology: Results for orders placed or performed during the hospital encounter of 10/24/2015  MRSA PCR Screening     Status: None   Collection Time: 10/26/2015  8:22 PM  Result Value Ref Range Status   MRSA by PCR NEGATIVE NEGATIVE Final    Comment:        The GeneXpert MRSA Assay (FDA approved for NASAL specimens only), is one component of a comprehensive MRSA colonization surveillance program. It is not intended to diagnose MRSA infection nor to guide or monitor treatment for MRSA infections.     Coagulation Studies:  Recent Labs  10/21/2015 1941  LABPROT 15.2*  INR 1.18    Urinalysis: No results for input(s): COLORURINE, LABSPEC, PHURINE, GLUCOSEU, HGBUR, BILIRUBINUR, KETONESUR, PROTEINUR, UROBILINOGEN, NITRITE, LEUKOCYTESUR in the last 72 hours.  Invalid input(s): APPERANCEUR    Imaging: Ct Abdomen Pelvis Wo Contrast  11/03/2015  CLINICAL DATA:  82 year old female with right-sided leg swelling and bruising following 8 cardiac catheterization today. Evaluate for potential retroperitoneal hemorrhage. EXAM: CT ABDOMEN AND PELVIS WITHOUT CONTRAST TECHNIQUE: Multidetector CT imaging of the abdomen and pelvis was performed following the standard protocol without IV contrast. COMPARISON:  No priors. FINDINGS: Lower chest: Trace bilateral pleural effusions. Heart size is mildly enlarged with left ventricular dilatation. Severe calcifications of the aortic valve and mitral valve/annulus. Atherosclerotic calcifications are noted within the left circumflex and right coronary  arteries. Hepatobiliary: No definite cystic or solid hepatic lesions are identified on today's noncontrast CT examination. Tiny calcified granuloma in the central aspect of the liver. Small calcified gallstones are noted dependently in the gallbladder. No current findings to suggest an acute cholecystitis at this time. Pancreas: No pancreatic mass or peripancreatic inflammatory changes on today's noncontrast CT examination. Spleen: Unremarkable. Adrenals/Urinary Tract: Kidneys are mildly atrophic bilaterally. Mild nodular thickening of the adrenal glands bilaterally appears relatively symmetric. No hydroureteronephrosis. Urinary bladder is normal in appearance. Stomach/Bowel: Unenhanced appearance of the stomach is normal. No pathologic dilatation of small bowel or colon. Numerous colonic diverticulae are noted, particularly throughout the descending colon and sigmoid colon, without surrounding inflammatory changes to suggest an acute diverticulitis at this time. Normal appendix. Vascular/Lymphatic: Atherosclerotic calcifications throughout the abdominal and pelvic vasculature, without definite aneurysm. No lymphadenopathy noted in the abdomen or pelvis. Reproductive: Status post hysterectomy. Ovaries are not confidently identified may be surgically absent or atrophic. Other: No high attenuation fluid collection in the retroperitoneum or within the peritoneal cavity. However, there is marked expansion and high attenuation throughout the anterior compartment of the visualized upper thigh immediately superficial to the right femoral arteries, presumably a large amount of intramuscular hemorrhage. Some intramuscular hemorrhage is also noted in the short adductors. This results in expansion of this musculature up to 9.8 x 7.9 cm (image 98 of series 2), which is markedly asymmetric with the contralateral side. Postoperative changes of mesh repair for ventral hernia or umbilical hernia are noted. Notably, there is  recurrent herniation adjacent to the left side of the mesh, containing a short segment of the mid transverse colon. Musculoskeletal: There are no aggressive appearing lytic or blastic lesions noted in the visualized portions of the skeleton. IMPRESSION: 1. Large amount of intramuscular hemorrhage within the anterior compartment of the upper right thigh and short adductors, as above. 2. No retroperitoneal hemorrhage identified at this time. 3. Status post mesh repair for ventral or umbilical hernia, with evidence of recurrent herniation along the left lateral aspect of the mesh, containing a short segment of mid transverse colon. No evidence of associated bowel incarceration or obstruction at this time. 4. Severe colonic diverticulosis without evidence of acute diverticulitis at this time. 5. Trace bilateral pleural effusions. 6. Mild cardiomegaly with left ventricular dilatation. 7. Atherosclerosis, including at least 2 vessel coronary artery disease. 8. There are calcifications of the aortic valve and mitral valve/annulus. Echocardiographic correlation for evaluation of potential valvular dysfunction may be warranted if clinically indicated. 9. Additional incidental findings, as above. These results will be called to the ordering clinician or representative by the Radiologist Assistant, and communication documented in the PACS or zVision Dashboard. Electronically Signed   By: Trudie Reed M.D.   On: 2015/11/03 16:02   [place] Lower Ext Art Right Ltd  10/22/2015  CLINICAL DATA:  Pseudoaneurysm EXAM: UNILATERAL RIGHT LOWER EXTREMITY ARTERIAL DUPLEX SCAN TECHNIQUE:  Gray-scale sonography as well as color Doppler and duplex ultrasound was performed to evaluate the arteries of the lower extremity. COMPARISON:  CT 11/11/2015 FINDINGS: There is a deep subcutaneous hematoma 15.6 x 2.7 x 6.8 cm. There is a pseudoaneurysm identified in the deep component of the hematoma. There is a neck from the pseudoaneurysm to the common  femoral artery. No evidence of AV fistula. IMPRESSION: 1. Pseudoaneurysm from the common femoral artery with large overlying hematoma. Electronically Signed   By: Corlis Leak M.D.   On: 11/04/2015 09:44   Dg Chest Port 1 View  10/23/2015  CLINICAL DATA:  Status post PICC line placement. EXAM: PORTABLE CHEST 1 VIEW COMPARISON:  10/12/2015 FINDINGS: A right-sided PICC has been placed and terminates over the mid to lower SVC. The cardiac silhouette remains mildly enlarged. Thoracic aortic calcification is noted. The patient has taken a shallower inspiration than on the prior study with accentuation of the central pulmonary vasculature. Mild bibasilar atelectasis is noted. No sizable pleural effusion or pneumothorax is identified. IMPRESSION: 1. Interval PICC placement terminating over the mid to lower SVC. 2. Mild bibasilar atelectasis. Electronically Signed   By: Sebastian Ache M.D.   On: 10/29/2015 15:12     Medications:   . sodium chloride 75 mL/hr at 11/12/2015 2050  . DOPamine 10 mcg/kg/min (10/29/15 0612)  . DOPamine 5 mcg/kg/min (10/19/2015 1759)  . phenylephrine (NEO-SYNEPHRINE) Adult infusion 260 mcg/min (10/29/15 1610)   . acetylcysteine  600 mg Oral BID  . insulin aspart  0-5 Units Subcutaneous QHS  . insulin aspart  0-9 Units Subcutaneous TID WC  . sodium chloride flush  3 mL Intravenous Q12H   acetaminophen, ondansetron (ZOFRAN) IV, sodium chloride flush  Assessment/ Plan:  82 y.o. female with a PMHx of diabetes mellitus type 2, hypertension, hyperlipidemia, atrial fibrillation, carotid stenosis, CVA, B12 deficiency who was admitted to [facility] on 11/12/2015 status post cardiac catheterization after she developed a right thigh hematoma.  1. Acute renal failure/chronic kidney disease stage III baseline Cr 1.3.1.5. Acute renal failure secondary to contrast-induced nephropathy/hypotension. Chronic kidney disease likely as a result of age, hypertension, and diabetes mellitus. - Urine output only  250 cc over the past 24 hours. BUN and creatinine pending this a.m. Significant hypotension noted overnight. Given this her acute renal failure is likely to progress. Given oliguria we will proceed with renal replacement therapy as discussed with the patient yesterday. This will be in the form of CRRT as the patient is currently on pressors.  2. Hypotension. Map has been less than 65 this a.m. Patient also noted to be tachycardic. I've advised critical care nursing to discontinue dopamine and start Levophed in its place. We will also consult pulmonary critical care for assistance with pressor management.  3. Anemia of chronic kidney disease.  Hemoglobin currently up to 9.9. No urgent indication for Procrit.   LOS: 2 Keldan Eplin 2/11/20177:59 AM

## 2015-10-29 NOTE — Op Note (Signed)
  OPERATIVE NOTE   PROCEDURE: 1. Ultrasound guidance for vascular access left femoral vein 2. Placement of a 30 cm Trialysis dialysis catheter left femoral vein  PRE-OPERATIVE DIAGNOSIS: 1. Acute renal failure   POST-OPERATIVE DIAGNOSIS: Same  SURGEON: Festus Barren, MD  ASSISTANT(S): None  ANESTHESIA: local  ESTIMATED BLOOD LOSS: Minimal   FINDING(S): 1. None  SPECIMEN(S): None  INDICATIONS:  Patient is a 82 yo WF who presents with ARF now after several other issues.  Risks and benefits were discussed, and informed consent was obtained..  DESCRIPTION: After obtaining full informed written consent, the patient was laid flat in the bed. The left groin was sterilely prepped and draped in a sterile surgical field was created. The left femoral vein was visualized with ultrasound and found to be widely patent. It was then accessed under direct guidance without difficulty with a Seldinger needle and a permanent image was recorded. A J-wire was then placed. After skin nick and dilatation, a 30 cm triple lumen dialysis catheter was placed over the wire and the wire was removed. The lumens withdrew dark red nonpulsatile blood and flushed easily with sterile saline. The catheter was secured to the skin with 3 nylon sutures. Sterile dressing was placed.  COMPLICATIONS: None  CONDITION: Stable  Linda Jones 10/29/2015 9:24 AM

## 2015-10-29 NOTE — Consult Note (Signed)
PHARMACY  Pharmacy Consult for: Review Meds DAILY for adjustment due to CRRT    Allergies  Allergen Reactions  . Erythromycin     Patient Measurements: Height:  (160 cm) Weight: 181 lb 3.2 oz (82.192 kg) IBW/kg (Calculated) : 52.4  Vital Signs: Temp: 98.6 F (37 C) (02/11 0200) Temp Source: Oral (02/11 0200) BP: 94/44 mmHg (02/11 0700) Pulse Rate: 127 (02/11 0700)   Recent Labs  11/14/2015 1643 11/04/2015 1941 10/23/2015 0021 11/04/2015 0402 10/26/2015 1253  WBC 6.6  --   --  10.0  --   HGB 9.9* 9.5* 8.6* 8.4* 9.9*  HCT 30.1*  --   --  26.1*  --   PLT 180  --   --  196  --   APTT  --  <24*  --   --   --   INR  --  1.18  --   --   --   CREATININE  --  2.37*  --   --   --   ALBUMIN  --  2.9*  --   --   --   PROT  --  5.4*  --   --   --   AST  --  13*  --   --   --   ALT  --  7*  --   --   --   ALKPHOS  --  42  --   --   --   BILITOT  --  0.7  --   --   --    Estimated Creatinine Clearance: 18.9 mL/min (by C-G formula based on Cr of 2.37).   Recent Labs  10/29/2015 1754 10/25/2015 2115 10/29/15 0805  GLUCAP 214* 270* 273*    Microbiology: Recent Results (from the past 720 hour(s))  MRSA PCR Screening     Status: None   Collection Time: 11/07/2015  8:22 PM  Result Value Ref Range Status   MRSA by PCR NEGATIVE NEGATIVE Final    Comment:        The GeneXpert MRSA Assay (FDA approved for NASAL specimens only), is one component of a comprehensive MRSA colonization surveillance program. It is not intended to diagnose MRSA infection nor to guide or monitor treatment for MRSA infections.     Medications:  Scheduled:  . acetylcysteine  600 mg Oral BID  . insulin aspart  0-5 Units Subcutaneous QHS  . insulin aspart  0-9 Units Subcutaneous TID WC  . sodium chloride flush  3 mL Intravenous Q12H   Infusions:  . sodium chloride 75 mL/hr at 10/19/2015 2050  . DOPamine 10 mcg/kg/min (10/29/15 0612)  . DOPamine 5 mcg/kg/min (11/12/2015 1759)  . norepinephrine  (LEVOPHED) Adult infusion 10 mcg/min (10/29/15 0839)  . phenylephrine (NEO-SYNEPHRINE) Adult infusion 260 mcg/min (10/29/15 0825)  . pureflow    . vasopressin (PITRESSIN) infusion - *FOR SHOCK*     PRN: acetaminophen, heparin, ondansetron (ZOFRAN) IV, sodium chloride flush  Assessment: 82 yo patient receiving CRRT. Pharmacy consulted for review Meds DAILY for adjustment due to CRRT  Plan:  At this all patient medications are doses appropriately for CRRT.  Pharmacy will continue to monitor meds daily and adjust doses as needed.   Cher Nakai, PharmD Pharmacy Resident  10/29/2015,8:46 AM

## 2015-10-29 NOTE — Progress Notes (Signed)
Pt taken off bipap and placed on 3lpm [place], sats 95%, respiratory rate 18/min, tolerating well at this time, pt alert and in no respiratory distress will continue to monitor

## 2015-10-29 NOTE — Consult Note (Signed)
[facility] [place] Pulmonary Medicine Consultation      Name: Linda Jones MRN: [id number] DOB: 05-18-1932    ADMISSION DATE:  10/20/2015    CHIEF COMPLAINT:   Acute resp distress   HISTORY OF PRESENT ILLNESS  82 y.o. female with a PMHx of diabetes mellitus type 2, hypertension, hyperlipidemia, atrial fibrillation, carotid stenosis, CVA, B12 deficiency who was admitted to [facility] on 11/04/2015 status post cardiac catheterization after she developed a right thigh hematoma.  -This was ultimately found to be a right sided femoral pseudoaneurysm. S/p stent placement by vasc surgery -now with  acute renal failure as she does have underlying chronic kidney disease stage III.  -her abd c/w acute resp and metabolic acidosis -she is also has very low BP started on 3 vasopressors  Patient received 3 units PRBc's -alert and awake, in resp distress, increased WOB   SIGNIFICANT EVENTS  S/p cardiac cath S/p vasc stent placement - vasc cath placement to be placed by Vasc surgery today   PAST MEDICAL HISTORY    :  Past Medical History  Diagnosis Date  . Heart murmur   . Diabetes mellitus without complication (HCC)   . Hypertension   . Hyperlipemia    Past Surgical History  Procedure Laterality Date  . Hernia repair  2005    umbilical  . Abdominal hysterectomy  2002    and BSO due to fibroids   Prior to Admission medications   Medication Sig Start Date End Date Taking? Authorizing Provider  aspirin 81 MG tablet Take by mouth See admin instructions. Takes 3 times per week   Yes Historical Provider, MD  calcium carbonate (OS-CAL) 600 MG TABS tablet Take by mouth.   Yes Historical Provider, MD  clopidogrel (PLAVIX) 75 MG tablet Take 1 tablet (75 mg total) by mouth daily. 09/27/15  Yes Richard Hulen Shouts., MD  cyanocobalamin (,VITAMIN B-12,) 1000 MCG/ML injection INJECT 1 ML IN THE MUSCLE ONCE MONTHLY 10/17/15  Yes Maple Hudson., MD  ferrous sulfate 325 (65 FE) MG tablet Take by  mouth.   Yes Historical Provider, MD  furosemide (LASIX) 20 MG tablet Take 1 tablet (20 mg total) by mouth daily. 10/11/15  Yes Richard Hulen Shouts., MD  glipiZIDE (GLUCOTROL) 5 MG tablet Take 1 tablet (5 mg total) by mouth daily. 09/27/15  Yes Richard Hulen Shouts., MD  hydrochlorothiazide (HYDRODIURIL) 25 MG tablet Take 1 tablet (25 mg total) by mouth daily. 09/27/15  Yes Richard Hulen Shouts., MD  lisinopril (PRINIVIL,ZESTRIL) 20 MG tablet Take 1 tablet (20 mg total) by mouth daily. 09/27/15  Yes Richard Hulen Shouts., MD  MULTIPLE VITAMIN PO Take by mouth.   Yes Historical Provider, MD  simvastatin (ZOCOR) 40 MG tablet Take 1 tablet (40 mg total) by mouth daily. 09/27/15  Yes Richard Hulen Shouts., MD  sitaGLIPtin (JANUVIA) 100 MG tablet Take 1 tablet (100 mg total) by mouth daily. 10/10/15  Yes Richard Hulen Shouts., MD  traMADol Janean Sark) 50 MG tablet 1 to 2 tablets at bedtime 09/27/15  Yes Richard Hulen Shouts., MD  traZODone (DESYREL) 100 MG tablet Take 1 tablet (100 mg total) by mouth at bedtime. 09/27/15  Yes Richard Hulen Shouts., MD  glucose blood test strip TRUETEST TEST (In Vitro Strip)  1 Strip two times daily and as needed for 0 days  Quantity: 100;  Refills: 12   Ordered :04-Jun-2012  Janey Greaser ;  Started 04-Jun-2012 Active Comments: Medication taken as  needed. 06/04/12   Historical Provider, MD  glucose blood test strip Check sugar once daily. DX:E11.9 09/21/15   Richard Hulen Shouts., MD  [facility] DELICA LANCETS 33G MISC Check sugar once daily. DX: E11.9 09/21/15   Richard Hulen Shouts., MD   Allergies  Allergen Reactions  . Erythromycin      FAMILY HISTORY   Family History  Problem Relation Age of Onset  . Heart disease Mother   . Hypertension Mother   . Diabetes Mother   . Heart attack Mother   . Arthritis Father   . Heart disease Father   . Heart attack Father   . CAD Brother   . Ovarian cancer Maternal Aunt   . Breast cancer Maternal Aunt   . CAD Brother        SOCIAL HISTORY    reports that she has quit smoking. She does not have any smokeless tobacco history on file. She reports that she does not drink alcohol or use illicit drugs.  Review of Systems  Constitutional: Positive for malaise/fatigue. Negative for fever, chills and weight loss.  Eyes: Negative for blurred vision and double vision.  Respiratory: Positive for cough, shortness of breath and wheezing. Negative for hemoptysis and sputum production.   Cardiovascular: Negative for chest pain, palpitations, orthopnea and leg swelling.  Gastrointestinal: Negative for heartburn, nausea, vomiting and abdominal pain.  Musculoskeletal: Negative for myalgias.  Skin: Negative for rash.  Neurological: Negative for dizziness, tingling and headaches.  Psychiatric/Behavioral: The patient is nervous/anxious.       VITAL SIGNS    Temp:  [97.5 F (36.4 C)-98.7 F (37.1 C)] 98.6 F (37 C) (02/11 0200) Pulse Rate:  [95-127] 127 (02/11 0700) Resp:  [17-41] 29 (02/11 0700) BP: (54-109)/(20-69) 94/44 mmHg (02/11 0700) SpO2:  [88 %-97 %] 93 % (02/11 0700) Weight:  [181 lb 3.2 oz (82.192 kg)] 181 lb 3.2 oz (82.192 kg) (02/11 0500) HEMODYNAMICS:   VENTILATOR SETTINGS:   INTAKE / OUTPUT:  Intake/Output Summary (Last 24 hours) at 10/29/15 0857 Last data filed at 10/29/15 0600  Gross per 24 hour  Intake 4076.99 ml  Output    250 ml  Net 3826.99 ml       PHYSICAL EXAM   Physical Exam  Constitutional: She is oriented to person, place, and time. She appears well-developed and well-nourished. She appears distressed.  HENT:  Head: Normocephalic and atraumatic.  Mouth/Throat: No oropharyngeal exudate.  Eyes: EOM are normal. Pupils are equal, round, and reactive to light. No scleral icterus.  Neck: Normal range of motion. Neck supple.  Cardiovascular: Normal rate, regular rhythm and normal heart sounds.   No murmur heard. Pulmonary/Chest: No stridor. She is in respiratory distress.  She has wheezes.  Abdominal: Soft. Bowel sounds are normal. She exhibits no distension. There is no tenderness.  Musculoskeletal: Normal range of motion. She exhibits no edema.  RT upper thigh hematoma  Neurological: She is alert and oriented to person, place, and time. She displays normal reflexes. Coordination normal.  Skin: Skin is warm. She is diaphoretic.  Psychiatric: She has a normal mood and affect.       LABS   LABS:  CBC  Recent Labs Lab 10/29/2015 1643  11/14/2015 0021 11/08/2015 0402 10/30/2015 1253  WBC 6.6  --   --  10.0  --   HGB 9.9*  < > 8.6* 8.4* 9.9*  HCT 30.1*  --   --  26.1*  --   PLT 180  --   --  196  --   < > = values in this interval not displayed. Coag's  Recent Labs Lab 11/15/2015 1941  APTT <24*  INR 1.18   BMET  Recent Labs Lab 11/11/2015 1941  NA 140  K 4.3  CL 101  CO2 30  BUN 55*  CREATININE 2.37*  GLUCOSE 284*   Electrolytes  Recent Labs Lab 10/30/2015 1941  CALCIUM 7.9*   Sepsis Markers No results for input(s): LATICACIDVEN, PROCALCITON, O2SATVEN in the last 168 hours. ABG  Recent Labs Lab 10/29/15 0850  PHART 7.21*  PCO2ART 65*  PO2ART 68*   Liver Enzymes  Recent Labs Lab 11/04/2015 1941  AST 13*  ALT 7*  ALKPHOS 42  BILITOT 0.7  ALBUMIN 2.9*   Cardiac Enzymes No results for input(s): TROPONINI, PROBNP in the last 168 hours. Glucose  Recent Labs Lab 11/05/2015 2030 Nov 05, 2015 0750 11-05-2015 1155 05-Nov-2015 1754 11/05/2015 2115 10/29/15 0805  GLUCAP 318* 218* 226* 214* 270* 273*     Recent Results (from the past 240 hour(s))  MRSA PCR Screening     Status: None   Collection Time: 11/03/2015  8:22 PM  Result Value Ref Range Status   MRSA by PCR NEGATIVE NEGATIVE Final    Comment:        The GeneXpert MRSA Assay (FDA approved for NASAL specimens only), is one component of a comprehensive MRSA colonization surveillance program. It is not intended to diagnose MRSA infection nor to guide or monitor treatment  for MRSA infections.      Current facility-administered medications:  .  0.9 %  sodium chloride infusion, , Intravenous, Continuous, Munsoor Lateef, MD, Last Rate: 75 mL/hr at 11/05/2015 2050 .  acetaminophen (TYLENOL) tablet 650 mg, 650 mg, Oral, Q4H PRN, Dwayne D Callwood, MD, 650 mg at 2015-11-05 1344 .  acetylcysteine (MUCOMYST) 20 % nebulizer / oral solution 600 mg, 600 mg, Oral, BID, Munsoor Lateef, MD, 600 mg at November 05, 2015 1851 .  DOPamine (INTROPIN) 800 mg in dextrose 5 % 250 mL (3.2 mg/mL) infusion, 0-20 mcg/kg/min, Intravenous, Titrated, Dwayne D Callwood, MD, Last Rate: 14 mL/hr at 10/29/15 0612, 10 mcg/kg/min at 10/29/15 0612 .  DOPamine (INTROPIN) 800 mg in dextrose 5 % 250 mL (3.2 mg/mL) infusion, 5 mcg/kg/min, Intravenous, Continuous, Annice Needy, MD, Last Rate: 7 mL/hr at 05-Nov-2015 1759, 5 mcg/kg/min at 11-05-2015 1759 .  heparin injection 1,000-6,000 Units, 1,000-6,000 Units, CRRT, PRN, Munsoor Lateef, MD .  insulin aspart (novoLOG) injection 0-5 Units, 0-5 Units, Subcutaneous, QHS, Alwyn Pea, MD, 3 Units at 05-Nov-2015 2119 .  insulin aspart (novoLOG) injection 0-9 Units, 0-9 Units, Subcutaneous, TID WC, Dwayne D Callwood, MD, 5 Units at 10/29/15 0844 .  norepinephrine (LEVOPHED) 4 mg in dextrose 5 % 250 mL (0.016 mg/mL) infusion, 12 mcg/min, Intravenous, Titrated, Munsoor Lateef, MD, Last Rate: 37.5 mL/hr at 10/29/15 0839, 10 mcg/min at 10/29/15 0839 .  ondansetron (ZOFRAN) injection 4 mg, 4 mg, Intravenous, Q6H PRN, Alwyn Pea, MD, 4 mg at 10/29/15 0823 .  phenylephrine (NEO-SYNEPHRINE) 40 mg in dextrose 5 % 250 mL (0.16 mg/mL) infusion, 0-400 mcg/min, Intravenous, Titrated, Dwayne D Callwood, MD, Last Rate: 97.5 mL/hr at 10/29/15 0825, 260 mcg/min at 10/29/15 0825 .  pureflow IV solution for Dialysis, , CRRT, Continuous, Munsoor Lateef, MD .  sodium chloride flush (NS) 0.9 % injection 3 mL, 3 mL, Intravenous, Q12H, Dwayne D Callwood, MD, 3 mL at 05-Nov-2015 2120 .  sodium  chloride flush (NS) 0.9 % injection 3 mL, 3 mL,  Intravenous, PRN, Alwyn Pea, MD .  vasopressin (PITRESSIN) 40 Units in sodium chloride 0.9 % 250 mL (0.16 Units/mL) infusion, 0.03 Units/min, Intravenous, Continuous, Munsoor Lateef, MD  IMAGING    [place] Lower Ext Art Right Ltd  10/29/2015  CLINICAL DATA:  Pseudoaneurysm EXAM: UNILATERAL RIGHT LOWER EXTREMITY ARTERIAL DUPLEX SCAN TECHNIQUE: Gray-scale sonography as well as color Doppler and duplex ultrasound was performed to evaluate the arteries of the lower extremity. COMPARISON:  CT 10/25/2015 FINDINGS: There is a deep subcutaneous hematoma 15.6 x 2.7 x 6.8 cm. There is a pseudoaneurysm identified in the deep component of the hematoma. There is a neck from the pseudoaneurysm to the common femoral artery. No evidence of AV fistula. IMPRESSION: 1. Pseudoaneurysm from the common femoral artery with large overlying hematoma. Electronically Signed   By: Corlis Leak M.D.   On: 10/29/15 09:44   Dg Chest Port 1 View  October 29, 2015  CLINICAL DATA:  Status post PICC line placement. EXAM: PORTABLE CHEST 1 VIEW COMPARISON:  10/12/2015 FINDINGS: A right-sided PICC has been placed and terminates over the mid to lower SVC. The cardiac silhouette remains mildly enlarged. Thoracic aortic calcification is noted. The patient has taken a shallower inspiration than on the prior study with accentuation of the central pulmonary vasculature. Mild bibasilar atelectasis is noted. No sizable pleural effusion or pneumothorax is identified. IMPRESSION: 1. Interval PICC placement terminating over the mid to lower SVC. 2. Mild bibasilar atelectasis. Electronically Signed   By: Sebastian Ache M.D.   On: 10-29-15 15:12      Indwelling Urinary Catheter continued, requirement due to   Reason to continue Indwelling Urinary Catheter for strict Intake/Output monitoring for hemodynamic instability   PICC Line continued, requirement due to   Reason to continue Central Line  Monitoring of central venous pressure or other hemodynamic parameters     INDWELLING DEVICES:: RT arm PICC line>>2/10 Vasc cath>>2/11  MICRO DATA: MRSA PCR >>negative     ASSESSMENT/PLAN   82 yo white female with acute shock likely hypovolemia in the setting of RT thigh hematoma complicated by acute renal failure with severe metabolic and resp acidosis  PULMONARY -Respiratory Failure -will try noninvasive BiPAP therapy and assess resp status -start Bronchodilator Therapy -patient at high risk for resp arrest and intubation  CARDIOVASCULAR-shock(hypovoluemia) Will use levophed only for now keep MAP >65 If dose increase-will start vasopressin -IVF's at current rate  RENAL Acute renal failure from ATN-contrast induced -vasc cath to be placed emergently -Nephrology consulted for CRRT  GASTROINTESTINAL Keep NPO for n ow  HEMATOLOGIC Follow cbc Transfuse as needed -ted's SCD for DVT prohylaxis   NEUROLOGIC avoid heavy sedatives    I have personally obtained a history, examined the patient, evaluated laboratory and independently reviewed  imaging results, formulated the assessment and plan and placed orders.  The Patient requires high complexity decision making for assessment and support, frequent evaluation and titration of therapies, application of advanced monitoring technologies and extensive interpretation of multiple databases. Critical Care Time devoted to patient care services described in this note is 50 minutes.   Overall, patient is critically ill, prognosis is guarded. Patient at high risk for cardiac arrest and death.    Lucie Leather, M.D.  [facility]  Medical Director [facility] Medical Director [facility] Cardio-Pulmonary Department

## 2015-10-29 NOTE — Progress Notes (Signed)
[facility]  Daily Progress Note   Subjective  - 1 Day Post-Op  Needs CRRT today Pain better Breathing poorly. Lots of coughing  Objective Filed Vitals:   10/29/15 0400 10/29/15 0500 10/29/15 0600 10/29/15 0700  BP: 96/54 100/29 95/67 94/44   Pulse: 125 125 125 127  Temp:      TempSrc:      Resp: Height:      Weight:  82.192 kg (181 lb 3.2 oz)    SpO2: 93% 94% 91% 93%    Intake/Output Summary (Last 24 hours) at 10/29/15 4098 Last data filed at 10/29/15 0600  Gross per 24 hour  Intake 4076.99 ml  Output    250 ml  Net 3826.99 ml    PULM  CTAB CV  RRR VASC  Feet cool Right groin bruising present  Laboratory CBC    Component Value Date/Time   WBC 10.0 11/02/2015 0402   WBC 8.7 10/10/2015 1630   WBC 7.1 05/13/2012 1105   HGB 9.9* 11/05/2015 1253   HGB 11.2* 05/13/2012 1105   HCT 26.1* 11/09/2015 0402   HCT 34.1 10/10/2015 1630   HCT 33.8* 05/13/2012 1105   PLT 196 11/11/2015 0402   PLT 394* 10/10/2015 1630   PLT 297 05/13/2012 1105    BMET    Component Value Date/Time   NA 140 10-29-15 1941   NA 143 10/10/2015 1630   NA 140 05/13/2012 1105   K 4.3 29-Oct-2015 1941   K 4.6 05/13/2012 1105   CL 101 10/29/2015 1941   CL 104 05/13/2012 1105   CO2 30 10-29-15 1941   CO2 33* 05/13/2012 1105   GLUCOSE 284* 2015-10-29 1941   GLUCOSE 139* 10/10/2015 1630   GLUCOSE 117* 05/13/2012 1105   BUN 55* 10/29/2015 1941   BUN 36* 10/10/2015 1630   BUN 30* 05/13/2012 1105   CREATININE 2.37* 2015-10-29 1941   CREATININE 1.4* 11/24/2014   CREATININE 1.05 05/13/2012 1105   CALCIUM 7.9* 2015/10/29 1941   CALCIUM 9.9 05/13/2012 1105   GFRNONAA 18* October 29, 2015 1941   GFRNONAA 51* 05/13/2012 1105   GFRAA 21* 2015-10-29 1941   GFRAA 59* 05/13/2012 1105    Assessment/Planning: POD #1 s/p right SFA stent for pseudoaneurysm   Bleeding stopped, but still with ARF from hypotension  CRRT today.  Catheter placed  Remains on  pressors with low BP.  Critically ill, high risk of cardiopulmonary failure    DEW,JASON  10/29/2015, 9:22 AM

## 2015-10-30 LAB — RENAL FUNCTION PANEL
ALBUMIN: 2.3 g/dL — AB (ref 3.5–5.0)
ANION GAP: 7 (ref 5–15)
ANION GAP: 8 (ref 5–15)
ANION GAP: 7 (ref 5–15)
Albumin: 2.2 g/dL — ABNORMAL LOW (ref 3.5–5.0)
Albumin: 2.5 g/dL — ABNORMAL LOW (ref 3.5–5.0)
Albumin: 2.3 g/dL — ABNORMAL LOW (ref 3.5–5.0)
Albumin: 1.9 g/dL — ABNORMAL LOW (ref 3.5–5.0)
Anion gap: 10 (ref 5–15)
Anion gap: 9 (ref 5–15)
BUN: 36 mg/dL — ABNORMAL HIGH (ref 6–20)
BUN: 34 mg/dL — ABNORMAL HIGH (ref 6–20)
BUN: 27 mg/dL — ABNORMAL HIGH (ref 6–20)
BUN: 26 mg/dL — ABNORMAL HIGH (ref 6–20)
BUN: 20 mg/dL (ref 6–20)
CALCIUM: 7.8 mg/dL — AB (ref 8.9–10.3)
CALCIUM: 6.7 mg/dL — AB (ref 8.9–10.3)
CHLORIDE: 105 mmol/L (ref 101–111)
CHLORIDE: 110 mmol/L (ref 101–111)
CO2: 23 mmol/L (ref 22–32)
CO2: 19 mmol/L — ABNORMAL LOW (ref 22–32)
CO2: 22 mmol/L (ref 22–32)
CO2: 23 mmol/L (ref 22–32)
CO2: 17 mmol/L — AB (ref 22–32)
CREATININE: 2.52 mg/dL — AB (ref 0.44–1.00)
CREATININE: 2.11 mg/dL — AB (ref 0.44–1.00)
CREATININE: 1.71 mg/dL — AB (ref 0.44–1.00)
Calcium: 7.2 mg/dL — ABNORMAL LOW (ref 8.9–10.3)
Calcium: 7.7 mg/dL — ABNORMAL LOW (ref 8.9–10.3)
Calcium: 8 mg/dL — ABNORMAL LOW (ref 8.9–10.3)
Chloride: 105 mmol/L (ref 101–111)
Chloride: 105 mmol/L (ref 101–111)
Chloride: 104 mmol/L (ref 101–111)
Creatinine, Ser: 2.67 mg/dL — ABNORMAL HIGH (ref 0.44–1.00)
Creatinine, Ser: 2.07 mg/dL — ABNORMAL HIGH (ref 0.44–1.00)
GFR calc Af Amer: 18 mL/min — ABNORMAL LOW (ref >60)
GFR calc Af Amer: 31 mL/min — ABNORMAL LOW (ref >60)
GFR calc non Af Amer: 16 mL/min — ABNORMAL LOW (ref >60)
GFR, EST AFRICAN AMERICAN: 19 mL/min — AB (ref >60)
GFR, EST AFRICAN AMERICAN: 24 mL/min — AB (ref >60)
GFR, EST AFRICAN AMERICAN: 25 mL/min — AB (ref >60)
GFR, EST NON AFRICAN AMERICAN: 17 mL/min — AB (ref >60)
GFR, EST NON AFRICAN AMERICAN: 21 mL/min — AB (ref >60)
GFR, EST NON AFRICAN AMERICAN: 21 mL/min — AB (ref >60)
GFR, EST NON AFRICAN AMERICAN: 27 mL/min — AB (ref >60)
GLUCOSE: 207 mg/dL — AB (ref 65–99)
GLUCOSE: 245 mg/dL — AB (ref 65–99)
Glucose, Bld: 223 mg/dL — ABNORMAL HIGH (ref 65–99)
Glucose, Bld: 273 mg/dL — ABNORMAL HIGH (ref 65–99)
Glucose, Bld: 238 mg/dL — ABNORMAL HIGH (ref 65–99)
PHOSPHORUS: 3.8 mg/dL (ref 2.5–4.6)
PHOSPHORUS: 3.2 mg/dL (ref 2.5–4.6)
POTASSIUM: 4.5 mmol/L (ref 3.5–5.1)
POTASSIUM: 4.8 mmol/L (ref 3.5–5.1)
POTASSIUM: 4.5 mmol/L (ref 3.5–5.1)
POTASSIUM: 4.5 mmol/L (ref 3.5–5.1)
Phosphorus: 4 mg/dL (ref 2.5–4.6)
Phosphorus: 3.4 mg/dL (ref 2.5–4.6)
Phosphorus: 2.6 mg/dL (ref 2.5–4.6)
Potassium: 3.7 mmol/L (ref 3.5–5.1)
SODIUM: 134 mmol/L — AB (ref 135–145)
SODIUM: 136 mmol/L (ref 135–145)
Sodium: 135 mmol/L (ref 135–145)
Sodium: 134 mmol/L — ABNORMAL LOW (ref 135–145)
Sodium: 135 mmol/L (ref 135–145)

## 2015-10-30 LAB — MAGNESIUM: Magnesium: 1.5 mg/dL — ABNORMAL LOW (ref 1.7–2.4)

## 2015-10-30 LAB — GLUCOSE, CAPILLARY
Glucose-Capillary: 207 mg/dL — ABNORMAL HIGH (ref 65–99)
Glucose-Capillary: 212 mg/dL — ABNORMAL HIGH (ref 65–99)
Glucose-Capillary: 228 mg/dL — ABNORMAL HIGH (ref 65–99)
Glucose-Capillary: 188 mg/dL — ABNORMAL HIGH (ref 65–99)
Glucose-Capillary: 166 mg/dL — ABNORMAL HIGH (ref 65–99)

## 2015-10-30 MED ORDER — MAGNESIUM SULFATE 2 GM/50ML IV SOLN
2.0000 g | Freq: Once | INTRAVENOUS | Status: AC
  Administered 2015-10-30: 2 g via INTRAVENOUS
  Filled 2015-10-30: qty 50

## 2015-10-30 MED ORDER — TRAZODONE HCL 100 MG PO TABS
100.0000 mg | ORAL_TABLET | Freq: Every evening | ORAL | Status: DC | PRN
  Administered 2015-10-30: 100 mg via ORAL
  Filled 2015-10-30: qty 1

## 2015-10-30 MED ORDER — ALTEPLASE 2 MG IJ SOLR
2.0000 mg | Freq: Once | INTRAMUSCULAR | Status: AC
  Administered 2015-10-30: 2 mg
  Filled 2015-10-30: qty 2

## 2015-10-30 MED ORDER — ALTEPLASE 2 MG IJ SOLR
2.0000 mg | Freq: Once | INTRAMUSCULAR | Status: AC | PRN
  Administered 2015-10-30: 2 mg
  Filled 2015-10-30: qty 2

## 2015-10-30 MED ORDER — STERILE WATER FOR INJECTION IJ SOLN
INTRAMUSCULAR | Status: AC
  Administered 2015-10-30: 10 mL
  Filled 2015-10-30: qty 10

## 2015-10-30 NOTE — Progress Notes (Signed)
Pt arterial line clotted off once again.  Rinse back attempted but with no success. Order to call MD if flow issues persist.  Per Dr. Cherylann Ratel, instill cathflo once again and let dwell for 2 1/2 hours then restart CRRT.

## 2015-10-30 NOTE — Progress Notes (Signed)
crrt stopped due to clotting, will activase and restart

## 2015-10-30 NOTE — Progress Notes (Signed)
[facility]  SUBJECTIVE: feels a little weak but alert and oriented despite being on crrt, vasopressin and levophed.No further bleeding   Filed Vitals:   10/30/15 1500 10/30/15 1600 10/30/15 1700 10/30/15 1800  BP: 135/101 109/76 165/141 121/96  Pulse: 40 135 132 131  Temp:  97.8 F (36.6 C)    TempSrc:  Oral    Resp: Height:      Weight:      SpO2: 100% 100% 100% 99%    Intake/Output Summary (Last 24 hours) at 10/30/15 1936 Last data filed at 10/30/15 1800  Gross per 24 hour  Intake 4746.96 ml  Output    219 ml  Net 4527.96 ml    LABS: Basic Metabolic Panel:  Recent Labs  16/10/96 0809 10/30/15 1226 10/30/15 1628  NA 134* 135 134*  K 4.8 4.5 4.5  CL 105 105 104  CO2 19* 22 23  GLUCOSE 223* 273* 245*  BUN 34* 27* 26*  CREATININE 2.52* 2.11* 2.07*  CALCIUM 7.7* 8.0* 7.8*  MG 1.5*  --   --   PHOS 3.8 3.4 3.2   Liver Function Tests:  Recent Labs  November 03, 2015 1941  10/30/15 1226 10/30/15 1628  AST 13*  --   --   --   ALT 7*  --   --   --   ALKPHOS 42  --   --   --   BILITOT 0.7  --   --   --   PROT 5.4*  --   --   --   ALBUMIN 2.9*  < > 2.3* 2.3*  < > = values in this interval not displayed. No results for input(s): LIPASE, AMYLASE in the last 72 hours. CBC:  Recent Labs  11/13/2015 0402  10/29/15 0926 10/29/15 1839  WBC 10.0  --  11.1*  --   HGB 8.4*  < > 9.6* 9.8*  HCT 26.1*  --  29.9* 30.0*  MCV 97.0  --  93.0  --   PLT 196  --  118*  --   < > = values in this interval not displayed. Cardiac Enzymes: No results for input(s): CKTOTAL, CKMB, CKMBINDEX, TROPONINI in the last 72 hours. BNP: Invalid input(s): POCBNP D-Dimer: No results for input(s): DDIMER in the last 72 hours. Hemoglobin A1C: No results for input(s): HGBA1C in the last 72 hours. Fasting Lipid Panel: No results for input(s): CHOL, HDL, LDLCALC, TRIG, CHOLHDL, LDLDIRECT in the last 72 hours. Thyroid Function Tests:  Recent  Labs  November 03, 2015 1941  TSH 0.800   Anemia Panel: No results for input(s): VITAMINB12, FOLATE, FERRITIN, TIBC, IRON, RETICCTPCT in the last 72 hours.   Physical Exam: Blood pressure 121/96, pulse 131, temperature 97.8 F (36.6 C), temperature source Oral, resp. rate 21, height  (1.6 m), weight 82.555 kg (182 lb), SpO2 99 %.    General appearance: alert and cooperative Resp: diminished breath sounds bilaterally Cardio: tachycardic at rates of 120-130 GI: soft, non-tender; bowel sounds normal; no masses,  no organomegaly Neurologic: Alert and oriented X 3, normal strength and tone. Normal symmetric reflexes. Normal coordination and gait  TELEMETRY: Reviewed telemetry pt in tachycardia:  ASSESSMENT AND PLAN:  Active Problems:   Hypotension-still not able to maintain adequate pressure of off pressors . Attempt was made to wean was not successful. No further bleeding noted. Very alert and oriented despite hemodynamics. Will continue crrt and continue to attempt weaning pressors as tolerated.  Dalia Heading., MD, [facility] 10/30/2015 7:36 PM

## 2015-10-30 NOTE — Consult Note (Signed)
PHARMACY  Pharmacy Consult for: Review Meds DAILY for adjustment due to CRRT    Allergies  Allergen Reactions  . Erythromycin     Patient Measurements: Height:  (160 cm) Weight: 182 lb (82.555 kg) IBW/kg (Calculated) : 52.4  Vital Signs: Temp: 97.6 F (36.4 C) (02/12 1100) Temp Source: Oral (02/12 1100) BP: 102/73 mmHg (02/12 1300) Pulse Rate: 135 (02/12 1100)   Recent Labs  10/20/2015 1643  11/02/2015 1941  10/25/2015 0402 10/30/2015 1253 10/29/15 0926  10/29/15 1839  10/30/15 0410 10/30/15 0809 10/30/15 1226  WBC 6.6  --   --   --  10.0  --  11.1*  --   --   --   --   --   --   HGB 9.9*  --  9.5*  < > 8.4* 9.9* 9.6*  --  9.8*  --   --   --   --   HCT 30.1*  --   --   --  26.1*  --  29.9*  --  30.0*  --   --   --   --   PLT 180  --   --   --  196  --  118*  --   --   --   --   --   --   APTT  --   --  <24*  --   --   --   --   --   --   --   --   --   --   INR  --   --  1.18  --   --   --   --   --   --   --   --   --   --   CREATININE  --   < > 2.37*  --   --   --  3.67*  < >  --   < > 2.67* 2.52* 2.11*  MG  --   --   --   --   --   --   --   --   --   --   --  1.5*  --   PHOS  --   --   --   --   --   --   --   < >  --   < > 4.0 3.8 3.4  ALBUMIN  --   --  2.9*  --   --   --   --   < >  --   < > 2.2* 2.5* 2.3*  PROT  --   --  5.4*  --   --   --   --   --   --   --   --   --   --   AST  --   --  13*  --   --   --   --   --   --   --   --   --   --   ALT  --   --  7*  --   --   --   --   --   --   --   --   --   --   ALKPHOS  --   --  42  --   --   --   --   --   --   --   --   --   --  BILITOT  --   --  0.7  --   --   --   --   --   --   --   --   --   --   < > = values in this interval not displayed. Estimated Creatinine Clearance: 21.3 mL/min (by C-G formula based on Cr of 2.11).   Recent Labs  10/29/15 2132 10/30/15 0727 10/30/15 1129  GLUCAP 127* 207* 212*    Microbiology: Recent Results (from the past 720 hour(s))  MRSA PCR Screening     Status: None    Collection Time: 11-13-15  8:22 PM  Result Value Ref Range Status   MRSA by PCR NEGATIVE NEGATIVE Final    Comment:        The GeneXpert MRSA Assay (FDA approved for NASAL specimens only), is one component of a comprehensive MRSA colonization surveillance program. It is not intended to diagnose MRSA infection nor to guide or monitor treatment for MRSA infections.     Medications:  Scheduled:  . alteplase  2 mg Intracatheter Once  . alteplase  2 mg Intracatheter Once  . budesonide (PULMICORT) nebulizer solution  0.5 mg Nebulization BID  . insulin aspart  0-5 Units Subcutaneous QHS  . insulin aspart  0-9 Units Subcutaneous TID WC  . ipratropium-albuterol  3 mL Nebulization Q4H  . sodium chloride flush  3 mL Intravenous Q12H   Infusions:  . sodium chloride 75 mL/hr at 10/29/15 2353  . norepinephrine (LEVOPHED) Adult infusion 25 mcg/min (10/30/15 0900)  . pureflow 2,500 mL/hr at 10/30/15 0830  . vasopressin (PITRESSIN) infusion - *FOR SHOCK* 0.03 Units/min (10/29/15 1427)   PRN: acetaminophen, heparin, ondansetron (ZOFRAN) IV, sodium chloride flush  Assessment: 82 yo patient receiving CRRT. Pharmacy consulted for review Meds DAILY for adjustment due to CRRT  Plan:  At this time, all patient medications are dosed appropriately for CRRT.  Pharmacy will continue to monitor meds daily and adjust doses as needed.   Bari Mantis PharmD Clinical Pharmacist  10/30/2015,1:48 PM

## 2015-10-30 NOTE — Progress Notes (Signed)
CRRT restarted at 6:10am.  Venous access pressure running high but CRRT currently in use.  Pt laying on right side.  Access lumens flushed with good blood return.

## 2015-10-30 NOTE — Progress Notes (Signed)
Restarted crrt 

## 2015-10-30 NOTE — Progress Notes (Signed)
BMP and renal lab draws were scheduled q4.  Renal included BMP, discontinued BMP.

## 2015-10-30 NOTE — Progress Notes (Signed)
[facility]  Daily Progress Note   Subjective  - 2 Days Post-Op  Breathing better Off bipap Still not much urine output More awake and conversant today  Objective Filed Vitals:   10/30/15 0745 10/30/15 0800 10/30/15 0845 10/30/15 0900  BP:  128/103 91/66 64/47  Pulse:    112  Temp:      TempSrc:      Resp:  Height:      Weight:      SpO2: 98% 99%  100%    Intake/Output Summary (Last 24 hours) at 10/30/15 0917 Last data filed at 10/30/15 0800  Gross per 24 hour  Intake 3191.06 ml  Output     70 ml  Net 3121.06 ml    PULM  CTAB CV  RRR VASC  Moderate bruising right groin Left thigh catheter site without bleeding  Laboratory CBC    Component Value Date/Time   WBC 11.1* 10/29/2015 0926   WBC 8.7 10/10/2015 1630   WBC 7.1 05/13/2012 1105   HGB 9.8* 10/29/2015 1839   HGB 11.2* 05/13/2012 1105   HCT 30.0* 10/29/2015 1839   HCT 34.1 10/10/2015 1630   HCT 33.8* 05/13/2012 1105   PLT 118* 10/29/2015 0926   PLT 394* 10/10/2015 1630   PLT 297 05/13/2012 1105    BMET    Component Value Date/Time   NA 135 10/30/2015 0410   NA 143 10/10/2015 1630   NA 140 05/13/2012 1105   K 4.5 10/30/2015 0410   K 4.6 05/13/2012 1105   CL 105 10/30/2015 0410   CL 104 05/13/2012 1105   CO2 23 10/30/2015 0410   CO2 33* 05/13/2012 1105   GLUCOSE 207* 10/30/2015 0410   GLUCOSE 139* 10/10/2015 1630   GLUCOSE 117* 05/13/2012 1105   BUN 36* 10/30/2015 0410   BUN 36* 10/10/2015 1630   BUN 30* 05/13/2012 1105   CREATININE 2.67* 10/30/2015 0410   CREATININE 1.4* 11/24/2014   CREATININE 1.05 05/13/2012 1105   CALCIUM 7.2* 10/30/2015 0410   CALCIUM 9.9 05/13/2012 1105   GFRNONAA 16* 10/30/2015 0410   GFRNONAA 51* 05/13/2012 1105   GFRAA 18* 10/30/2015 0410   GFRAA 59* 05/13/2012 1105    Assessment/Planning: POD #2 s/p covered stent to treat pseudoaneurysm   Pressors off but BP still low  Her urine output is minimal  Breathing better  after taking off some fluid  Overall, still critically ill with multiple ongoing issues  Would avoid Right IJ catheter placement if possible as she will likely need a permcath there    DEW,JASON  10/30/2015, 9:17 AM

## 2015-10-30 NOTE — Consult Note (Signed)
[facility] Pulmonary Medicine Consultation      Name: Linda Jones MRN: [id number] DOB: [birth year]    ADMISSION DATE:  11-22-2015    CHIEF COMPLAINT:   Acute resp distress   HISTORY OF PRESENT ILLNESS  Tolerated biPAP and BD therapy, less WOB today,  -alert and awake, on CRRT, vasopressors turned off On [place] oxygen, son at bedside  SIGNIFICANT EVENTS  S/p cardiac cath S/p vasc stent placement - vasc cath placement 2/11   Allergies  Allergen Reactions  . Erythromycin      Review of Systems  Constitutional: Positive for malaise/fatigue. Negative for fever, chills and weight loss.  Eyes: Negative for blurred vision and double vision.  Respiratory: Positive for cough, shortness of breath and wheezing. Negative for hemoptysis and sputum production.   Cardiovascular: Negative for chest pain, palpitations, orthopnea and leg swelling.  Gastrointestinal: Negative for heartburn, nausea, vomiting and abdominal pain.  Musculoskeletal: Negative for myalgias.  Skin: Negative for rash.  Neurological: Negative for dizziness, tingling and headaches.  Psychiatric/Behavioral: The patient is nervous/anxious.       VITAL SIGNS    Temp:  [97.4 F (36.3 C)-98.4 F (36.9 C)] 98.4 F (36.9 C) (02/12 0706) Pulse Rate:  [99-132] 126 (02/12 0706) Resp:  [17-32] 21 (02/12 0800) BP: (61-135)/(26-118) 128/103 mmHg (02/12 0800) SpO2:  [94 %-100 %] 99 % (02/12 0800) FiO2 (%):  [28 %] 28 % (02/12 0032) Weight:  [182 lb (82.555 kg)] 182 lb (82.555 kg) (02/12 0500) HEMODYNAMICS:   VENTILATOR SETTINGS: Vent Mode:  [-]  FiO2 (%):  [28 %] 28 % INTAKE / OUTPUT:  Intake/Output Summary (Last 24 hours) at 10/30/15 0831 Last data filed at 10/30/15 0700  Gross per 24 hour  Intake 3116.06 ml  Output     45 ml  Net 3071.06 ml       PHYSICAL EXAM   Physical Exam  Constitutional: She is oriented to person, place, and time. She appears well-developed and well-nourished. No distress.    HENT:  Head: Normocephalic and atraumatic.  Mouth/Throat: No oropharyngeal exudate.  Eyes: EOM are normal. Pupils are equal, round, and reactive to light. No scleral icterus.  Neck: Normal range of motion. Neck supple.  Cardiovascular: Normal rate, regular rhythm and normal heart sounds.   No murmur heard. Pulmonary/Chest: No stridor. No respiratory distress. She has wheezes. She has rales.  Abdominal: Soft. Bowel sounds are normal. She exhibits no distension. There is no tenderness.  Musculoskeletal: Normal range of motion. She exhibits no edema.  RT upper thigh hematoma  Neurological: She is alert and oriented to person, place, and time. She displays normal reflexes. Coordination normal.  Skin: Skin is warm. She is diaphoretic.  Psychiatric: She has a normal mood and affect.       LABS   LABS:  CBC  Recent Labs Lab 11-22-2015 1643  10/30/2015 0402 11/14/2015 1253 10/29/15 0926 10/29/15 1839  WBC 6.6  --  10.0  --  11.1*  --   HGB 9.9*  < > 8.4* 9.9* 9.6* 9.8*  HCT 30.1*  --  26.1*  --  29.9* 30.0*  PLT 180  --  196  --  118*  --   < > = values in this interval not displayed. Coag's  Recent Labs Lab 11/22/2015 1941  APTT <24*  INR 1.18   BMET  Recent Labs Lab 10/29/15 1800 10/29/15 2316 10/30/15 0410  NA 132*  132* 134* 135  K 4.0  4.0 4.2 4.5  CL  100*  101 102 105  CO2 BUN 43*  43* 36* 36*  CREATININE 2.81*  2.88* 2.57* 2.67*  GLUCOSE 226*  228* 128* 207*   Electrolytes  Recent Labs Lab 10/29/15 1800 10/29/15 2316 10/30/15 0410  CALCIUM 7.2*  7.2* 7.6* 7.2*  PHOS 3.9 3.5 4.0   Sepsis Markers No results for input(s): LATICACIDVEN, PROCALCITON, O2SATVEN in the last 168 hours. ABG  Recent Labs Lab 10/29/15 0850  PHART 7.21*  PCO2ART 65*  PO2ART 68*   Liver Enzymes  Recent Labs Lab 11/06/2015 1941  10/29/15 1800 10/29/15 2316 10/30/15 0410  AST 13*  --   --   --   --   ALT 7*  --   --   --   --   ALKPHOS 42  --    --   --   --   BILITOT 0.7  --   --   --   --   ALBUMIN 2.9*  < > 2.2* 2.4* 2.2*  < > = values in this interval not displayed. Cardiac Enzymes No results for input(s): TROPONINI, PROBNP in the last 168 hours. Glucose  Recent Labs Lab 10/29/15 0805 10/29/15 1204 10/29/15 1659 10/29/15 2120 10/29/15 2132 10/30/15 0727  GLUCAP 273* 213* 254* 146* 127* 207*     Recent Results (from the past 240 hour(s))  MRSA PCR Screening     Status: None   Collection Time: 10/30/2015  8:22 PM  Result Value Ref Range Status   MRSA by PCR NEGATIVE NEGATIVE Final    Comment:        The GeneXpert MRSA Assay (FDA approved for NASAL specimens only), is one component of a comprehensive MRSA colonization surveillance program. It is not intended to diagnose MRSA infection nor to guide or monitor treatment for MRSA infections.      Current facility-administered medications:  .  0.9 %  sodium chloride infusion, , Intravenous, Continuous, Munsoor Lateef, MD, Last Rate: 75 mL/hr at 10/29/15 2353 .  acetaminophen (TYLENOL) tablet 650 mg, 650 mg, Oral, Q4H PRN, Dwayne D Callwood, MD, 650 mg at 10/29/15 1821 .  acetylcysteine (MUCOMYST) 20 % nebulizer / oral solution 600 mg, 600 mg, Oral, BID, Munsoor Lateef, MD, 600 mg at 10/29/15 1528 .  budesonide (PULMICORT) nebulizer solution 0.5 mg, 0.5 mg, Nebulization, BID, Erin Fulling, MD, 0.5 mg at 10/30/15 0744 .  heparin injection 1,000-6,000 Units, 1,000-6,000 Units, CRRT, PRN, Munsoor Lateef, MD, 1,000 Units at 10/30/15 0408 .  insulin aspart (novoLOG) injection 0-5 Units, 0-5 Units, Subcutaneous, QHS, Alwyn Pea, MD, 3 Units at 2015-11-07 2119 .  insulin aspart (novoLOG) injection 0-9 Units, 0-9 Units, Subcutaneous, TID WC, Dwayne D Callwood, MD, 3 Units at 10/30/15 0800 .  ipratropium-albuterol (DUONEB) 0.5-2.5 (3) MG/3ML nebulizer solution 3 mL, 3 mL, Nebulization, Q4H, Erin Fulling, MD, 3 mL at 10/30/15 0744 .  norepinephrine (LEVOPHED) 16 mg in  dextrose 5 % 250 mL (0.064 mg/mL) infusion, 4 mcg/min, Intravenous, Titrated, Erin Fulling, MD, Last Rate: 18.8 mL/hr at 10/30/15 0636, 20 mcg/min at 10/30/15 0636 .  ondansetron (ZOFRAN) injection 4 mg, 4 mg, Intravenous, Q6H PRN, Alwyn Pea, MD, 4 mg at 10/29/15 0823 .  pureflow IV solution for Dialysis, , CRRT, Continuous, Munsoor Lateef, MD, Last Rate: 2,500 mL/hr at 10/30/15 0830 .  sodium chloride flush (NS) 0.9 % injection 3 mL, 3 mL, Intravenous, Q12H, Dwayne D Callwood, MD, 3 mL at 10/29/15 2125 .  sodium chloride flush (  NS) 0.9 % injection 3 mL, 3 mL, Intravenous, PRN, Dwayne D Callwood, MD .  vasopressin (PITRESSIN) 40 Units in sodium chloride 0.9 % 250 mL (0.16 Units/mL) infusion, 0.03 Units/min, Intravenous, Continuous, Erin Fulling, MD, Last Rate: 11.3 mL/hr at 10/29/15 1427, 0.03 Units/min at 10/29/15 1427  IMAGING    No results found.    Indwelling Urinary Catheter continued, requirement due to   Reason to continue Indwelling Urinary Catheter for strict Intake/Output monitoring for hemodynamic instability   PICC Line continued, requirement due to   Reason to continue Central Line Monitoring of central venous pressure or other hemodynamic parameters     INDWELLING DEVICES:: RT arm PICC line>>2/10 Vasc cath>>2/11  MICRO DATA: MRSA PCR >>negative     ASSESSMENT/PLAN   82 yo white female with acute shock likely hypovolemia in the setting of RT thigh hematoma complicated by acute renal failure with severe metabolic and resp acidosis,shock slowly improving  PULMONARY -Respiratory Failure-slowly resolving -will try noninvasive BiPAP as needed -started Bronchodilator Therapy  CARDIOVASCULAR-shock(hypovoluemia) Will use levophed only for now keep MAP >65 if needed  RENAL Acute renal failure from ATN-contrast induced -vasc cath placed  -Nephrology consulted for CRRT-may need to consider intermittent HD  GASTROINTESTINAL Advance diet as  tolerated  HEMATOLOGIC Follow cbc Transfuse as needed -ted's SCD for DVT prohylaxis   NEUROLOGIC avoid heavy sedatives     The Patient requires high complexity decision making for assessment and support, frequent evaluation and titration of therapies, application of advanced monitoring technologies and extensive interpretation of multiple databases.  Prognosis is guarded.    Lucie Leather, M.D.  [facility]  Medical Director [facility] Medical Director [facility] Cardio-Pulmonary Department

## 2015-10-30 NOTE — Progress Notes (Signed)
[facility]  ROUNDING NOTE   Subjective:  Oliguria persists unfortunately. Still has periods of low MAP. Was on vasopressin and levophed.  Currently awake and alert.   CRRT continues to progress well.   Objective:  Vital signs in last 24 hours:  Temp:  [97.4 F (36.3 C)-98.4 F (36.9 C)] 98.4 F (36.9 C) (02/12 0706) Pulse Rate:  [99-126] 112 (02/12 0900) Resp:  [17-26] 26 (02/12 0900) BP: (61-135)/(27-118) 64/47 mmHg (02/12 0900) SpO2:  [95 %-100 %] 100 % (02/12 0900) FiO2 (%):  [28 %] 28 % (02/12 0032) Weight:  [82.555 kg (182 lb)] 82.555 kg (182 lb) (02/12 0500)  Weight change: 0.363 kg (12.8 oz) Filed Weights   10/30/2015 0551 10/29/15 0500 10/30/15 0500  Weight: 74.753 kg (164 lb 12.8 oz) 82.192 kg (181 lb 3.2 oz) 82.555 kg (182 lb)    Intake/Output: I/O last 3 completed shifts: In: 5621.2 [I.V.:5234.2; Blood:387] Out: 95 [Urine:95]   Intake/Output this shift:  Total I/O In: 75 [I.V.:75] Out: 25 [Other:25]  Physical Exam: General: NAD, resting in bed  Head: Normocephalic, atraumatic. Moist oral mucosal membranes  Eyes: Anicteric  Neck: Supple, trachea midline  Lungs:  Clear to auscultation normal effort  Heart: S1S2 irregular at times  Abdomen:  Soft, nontender, BS present  Extremities: 1+ peripheral edema.  Neurologic: Nonfocal, moving all four extremities  Skin: No lesions  Access:  Left femoral temp HD catheter 10/29/15 Dr. Wyn Quaker    Basic Metabolic Panel:  Recent Labs Lab 10/29/15 0926 10/29/15 1025 10/29/15 1800 10/29/15 2316 10/30/15 0410 10/30/15 0809  NA 132* 130*  132* 132*  132* 134* 135  --   K 4.2 4.2  4.2 4.0  4.0 4.2 4.5  --   CL 96* 96*  96* 100*  101 102 105  --   CO2 --   GLUCOSE 291* 280*  283* 226*  228* 128* 207*  --   BUN 56* 56*  58* 43*  43* 36* 36*  --   CREATININE 3.67* 3.69*  3.75* 2.81*  2.88* 2.57* 2.67*  --   CALCIUM 7.5* 7.4*  7.5* 7.2*  7.2* 7.6* 7.2*  --   MG  --    --   --   --   --  1.5*  PHOS  --  5.4* 3.9 3.5 4.0  --     Liver Function Tests:  Recent Labs Lab 11/23/2015 1941 10/29/15 1025 10/29/15 1800 10/29/15 2316 10/30/15 0410  AST 13*  --   --   --   --   ALT 7*  --   --   --   --   ALKPHOS 42  --   --   --   --   BILITOT 0.7  --   --   --   --   PROT 5.4*  --   --   --   --   ALBUMIN 2.9* 2.6* 2.2* 2.4* 2.2*   No results for input(s): LIPASE, AMYLASE in the last 168 hours. No results for input(s): AMMONIA in the last 168 hours.  CBC:  Recent Labs Lab November 23, 2015 1643  10/30/2015 0021 11/08/2015 0402 11/04/2015 1253 10/29/15 0926 10/29/15 1839  WBC 6.6  --   --  10.0  --  11.1*  --   HGB 9.9*  < > 8.6* 8.4* 9.9* 9.6* 9.8*  HCT 30.1*  --   --  26.1*  --  29.9* 30.0*  MCV 95.0  --   --  97.0  --  93.0  --   PLT 180  --   --  196  --  118*  --   < > = values in this interval not displayed.  Cardiac Enzymes: No results for input(s): CKTOTAL, CKMB, CKMBINDEX, TROPONINI in the last 168 hours.  BNP: Invalid input(s): POCBNP  CBG:  Recent Labs Lab 10/29/15 1204 10/29/15 1659 10/29/15 2120 10/29/15 2132 10/30/15 0727  GLUCAP 213* 254* 146* 127* 207*    Microbiology: Results for orders placed or performed during the hospital encounter of 11-09-15  MRSA PCR Screening     Status: None   Collection Time: November 09, 2015  8:22 PM  Result Value Ref Range Status   MRSA by PCR NEGATIVE NEGATIVE Final    Comment:        The GeneXpert MRSA Assay (FDA approved for NASAL specimens only), is one component of a comprehensive MRSA colonization surveillance program. It is not intended to diagnose MRSA infection nor to guide or monitor treatment for MRSA infections.     Coagulation Studies:  Recent Labs  11-09-15 1941  LABPROT 15.2*  INR 1.18    Urinalysis: No results for input(s): COLORURINE, LABSPEC, PHURINE, GLUCOSEU, HGBUR, BILIRUBINUR, KETONESUR, PROTEINUR, UROBILINOGEN, NITRITE, LEUKOCYTESUR in the last 72  hours.  Invalid input(s): APPERANCEUR    Imaging: Dg Chest Port 1 View  10/19/2015  CLINICAL DATA:  Status post PICC line placement. EXAM: PORTABLE CHEST 1 VIEW COMPARISON:  10/12/2015 FINDINGS: A right-sided PICC has been placed and terminates over the mid to lower SVC. The cardiac silhouette remains mildly enlarged. Thoracic aortic calcification is noted. The patient has taken a shallower inspiration than on the prior study with accentuation of the central pulmonary vasculature. Mild bibasilar atelectasis is noted. No sizable pleural effusion or pneumothorax is identified. IMPRESSION: 1. Interval PICC placement terminating over the mid to lower SVC. 2. Mild bibasilar atelectasis. Electronically Signed   By: Sebastian Ache M.D.   On: 10/20/2015 15:12     Medications:   . sodium chloride 75 mL/hr at 10/29/15 2353  . norepinephrine (LEVOPHED) Adult infusion 20 mcg/min (10/30/15 0636)  . pureflow 2,500 mL/hr at 10/30/15 0830  . vasopressin (PITRESSIN) infusion - *FOR SHOCK* 0.03 Units/min (10/29/15 1427)   . acetylcysteine  600 mg Oral BID  . budesonide (PULMICORT) nebulizer solution  0.5 mg Nebulization BID  . insulin aspart  0-5 Units Subcutaneous QHS  . insulin aspart  0-9 Units Subcutaneous TID WC  . ipratropium-albuterol  3 mL Nebulization Q4H  . magnesium sulfate 1 - 4 g bolus IVPB  2 g Intravenous Once  . sodium chloride flush  3 mL Intravenous Q12H   acetaminophen, heparin, ondansetron (ZOFRAN) IV, sodium chloride flush  Assessment/ Plan:  82 y.o. female with a PMHx of diabetes mellitus type 2, hypertension, hyperlipidemia, atrial fibrillation, carotid stenosis, CVA, B12 deficiency who was admitted to [facility] on 11-09-15 status post cardiac catheterization after she developed a right thigh hematoma and pseudoaneurysm s/p repair.  1. Acute renal failure/chronic kidney disease stage III baseline Cr 1.3.1.5. Acute renal failure secondary to contrast-induced nephropathy 2 recent contrast  procedures/hypotension. Chronic kidney disease likely as a result of age, hypertension, and diabetes mellitus. - Urine outpt in oligoanuric range at 45cc over the past 3 shifts, therefore will continue CRRT at this time using 4K bath.  Continue to monitor electrolytes.  2. Hypotension. MAP currently less than 65, vasopressin was weaned off, still on levophed, discussed  with nursing to maintain map of 65 or greater.  ATN may extend given hypotension.  3. Anemia of chronic kidney disease.  Hemoglobin 9.8 at the moment, no indication for procrit.  4.  Hypomagnesemia.  Magnesium at 1.5, will replete with magnesium sulfate 2g IV x one today.   LOS: 3 Clorinda Wyble 2/12/20179:36 AM

## 2015-10-31 ENCOUNTER — Encounter: Payer: Self-pay | Admitting: Internal Medicine

## 2015-10-31 DIAGNOSIS — D62 Acute posthemorrhagic anemia: Secondary | ICD-10-CM

## 2015-10-31 DIAGNOSIS — D696 Thrombocytopenia, unspecified: Secondary | ICD-10-CM

## 2015-10-31 DIAGNOSIS — N179 Acute kidney failure, unspecified: Secondary | ICD-10-CM

## 2015-10-31 LAB — RENAL FUNCTION PANEL
ALBUMIN: 2 g/dL — AB (ref 3.5–5.0)
ALBUMIN: 2.5 g/dL — AB (ref 3.5–5.0)
Albumin: 2 g/dL — ABNORMAL LOW (ref 3.5–5.0)
Albumin: 2.4 g/dL — ABNORMAL LOW (ref 3.5–5.0)
Anion gap: 6 (ref 5–15)
Anion gap: 5 (ref 5–15)
Anion gap: 5 (ref 5–15)
Anion gap: 12 (ref 5–15)
BUN: 17 mg/dL (ref 6–20)
BUN: 14 mg/dL (ref 6–20)
BUN: 14 mg/dL (ref 6–20)
BUN: 15 mg/dL (ref 6–20)
CALCIUM: 6.8 mg/dL — AB (ref 8.9–10.3)
CALCIUM: 8.2 mg/dL — AB (ref 8.9–10.3)
CHLORIDE: 108 mmol/L (ref 101–111)
CHLORIDE: 106 mmol/L (ref 101–111)
CHLORIDE: 105 mmol/L (ref 101–111)
CO2: 21 mmol/L — ABNORMAL LOW (ref 22–32)
CO2: 22 mmol/L (ref 22–32)
CO2: 20 mmol/L — ABNORMAL LOW (ref 22–32)
CO2: 26 mmol/L (ref 22–32)
CREATININE: 1.19 mg/dL — AB (ref 0.44–1.00)
CREATININE: 1.23 mg/dL — AB (ref 0.44–1.00)
CREATININE: 1.13 mg/dL — AB (ref 0.44–1.00)
Calcium: 6.9 mg/dL — ABNORMAL LOW (ref 8.9–10.3)
Calcium: 8.4 mg/dL — ABNORMAL LOW (ref 8.9–10.3)
Chloride: 107 mmol/L (ref 101–111)
Creatinine, Ser: 1.36 mg/dL — ABNORMAL HIGH (ref 0.44–1.00)
GFR calc Af Amer: 41 mL/min — ABNORMAL LOW (ref >60)
GFR calc Af Amer: 46 mL/min — ABNORMAL LOW (ref >60)
GFR calc Af Amer: 51 mL/min — ABNORMAL LOW (ref >60)
GFR calc non Af Amer: 35 mL/min — ABNORMAL LOW (ref >60)
GFR calc non Af Amer: 40 mL/min — ABNORMAL LOW (ref >60)
GFR, EST AFRICAN AMERICAN: 48 mL/min — AB (ref >60)
GFR, EST NON AFRICAN AMERICAN: 42 mL/min — AB (ref >60)
GFR, EST NON AFRICAN AMERICAN: 44 mL/min — AB (ref >60)
GLUCOSE: 305 mg/dL — AB (ref 65–99)
GLUCOSE: 179 mg/dL — AB (ref 65–99)
Glucose, Bld: 302 mg/dL — ABNORMAL HIGH (ref 65–99)
Glucose, Bld: 144 mg/dL — ABNORMAL HIGH (ref 65–99)
PHOSPHORUS: 2.3 mg/dL — AB (ref 2.5–4.6)
PHOSPHORUS: 2.1 mg/dL — AB (ref 2.5–4.6)
POTASSIUM: 3.7 mmol/L (ref 3.5–5.1)
Phosphorus: 2.5 mg/dL (ref 2.5–4.6)
Phosphorus: 2.9 mg/dL (ref 2.5–4.6)
Potassium: 3.6 mmol/L (ref 3.5–5.1)
Potassium: 4.6 mmol/L (ref 3.5–5.1)
Potassium: 4.9 mmol/L (ref 3.5–5.1)
SODIUM: 134 mmol/L — AB (ref 135–145)
SODIUM: 137 mmol/L (ref 135–145)
Sodium: 135 mmol/L (ref 135–145)
Sodium: 137 mmol/L (ref 135–145)

## 2015-10-31 LAB — GLUCOSE, CAPILLARY
GLUCOSE-CAPILLARY: 176 mg/dL — AB (ref 65–99)
GLUCOSE-CAPILLARY: 158 mg/dL — AB (ref 65–99)
Glucose-Capillary: 169 mg/dL — ABNORMAL HIGH (ref 65–99)

## 2015-10-31 LAB — CBC WITH DIFFERENTIAL/PLATELET
Basophils Absolute: 0 10*3/uL (ref 0–0.1)
EOS ABS: 0 10*3/uL (ref 0–0.7)
Eosinophils Relative: 0 %
HCT: 24.6 % — ABNORMAL LOW (ref 35.0–47.0)
HEMOGLOBIN: 8 g/dL — AB (ref 12.0–16.0)
LYMPHS ABS: 0.2 10*3/uL — AB (ref 1.0–3.6)
Lymphocytes Relative: 2 %
MCH: 30 pg (ref 26.0–34.0)
MCHC: 32.6 g/dL (ref 32.0–36.0)
MCV: 91.9 fL (ref 80.0–100.0)
MONO ABS: 0.6 10*3/uL (ref 0.2–0.9)
Neutro Abs: 9.7 10*3/uL — ABNORMAL HIGH (ref 1.4–6.5)
Platelets: 90 10*3/uL — ABNORMAL LOW (ref 150–440)
RBC: 2.67 MIL/uL — ABNORMAL LOW (ref 3.80–5.20)
RDW: 18.3 % — AB (ref 11.5–14.5)
WBC: 10.5 10*3/uL (ref 3.6–11.0)

## 2015-10-31 LAB — MAGNESIUM: MAGNESIUM: 1.7 mg/dL (ref 1.7–2.4)

## 2015-10-31 MED ORDER — HEPARIN SODIUM (PORCINE) 1000 UNIT/ML DIALYSIS: 1000.0000 [IU] | INTRAMUSCULAR | Status: AC | PRN

## 2015-10-31 MED ORDER — ENOXAPARIN SODIUM 40 MG/0.4ML ~~LOC~~ SOLN: 40.0000 mg | SUBCUTANEOUS | Status: DC

## 2015-11-16 NOTE — Progress Notes (Signed)
eLink Physician-Brief Progress Note Patient Name: Linda Jones DOB: [birth year] MRN: [id number]   Date of Service  10/19/2015  HPI/Events of Note  C/o slurred speech - dentures out Hypotensive -confirmed by aline - on max levo gtt Severe AS noted  eICU Interventions  Add vaso Spoke to son Amada Jupiter- cannot be transported for head CT - not a candidate for CVA interventions neurochecks     Intervention Category Major Interventions: Change in mental status - evaluation and management;Hypotension - evaluation and management  ALVA,RAKESH V. 11/10/2015, 6:04 PM

## 2015-11-16 NOTE — Care Management (Signed)
Patient transferred to icu from 2A.  had vascular procedure for pseudocyst.  On pressors for low blood pressure. Worsening renal failure- most likely due to contrast dye and possibly some of her pressors. Remains on crt.

## 2015-11-16 NOTE — Progress Notes (Signed)
eLink Physician-Brief Progress Note Patient Name: Linda Jones DOB: 06/23/1932 MRN: [id number]   Date of Service  11/01/2015  HPI/Events of Note  Spoke to son Linda Jones Interventions  Limited code orders issued No CPR, no CV, ct pressors     Intervention Category Major Interventions: End of life / care limitation discussion  Linda Jones V. 10/24/2015, 6:39 PM

## 2015-11-16 NOTE — Progress Notes (Signed)
During rounds RN spoke with Dr. Dema Severin regarding patient's blood pressure remaining low and that RN continues to titrate levophed drip up.  RN was unable to palpate pulses therefore pulses were doplered and patient remains A&Ox4 with periods of forgetfulness.  RN asked Dr. Dema Severin about the need for an arterial line to better manage blood pressure and titrate levophed drip accurately.  Dr. Dema Severin asked RN to check patient's blood pressure in both legs.

## 2015-11-16 NOTE — Progress Notes (Signed)
NO URINE OUTPUT 

## 2015-11-16 NOTE — Progress Notes (Signed)
Patient remains A&Ox4.  Blood pressure continues to be low but patient is asymptomatic.  Awaiting for Dr. Dema Severin to place art line.

## 2015-11-16 NOTE — Progress Notes (Signed)
RN called [facility] and spoke with Dr. Vassie Loll and made MD aware that patient's son Amada Jupiter asked RN about making patient a DNR and that RN spoke with patient and asked patient if her heart stopped did she want chest compressions performed and that if she stopped breathing would she want a machine to help her breathe and patient answered no to both questions.  Patient alert to self, month and place at the time these questions were asked to her. Dr. Vassie Loll spoke with patient's son, Amada Jupiter on the phone at this time and he confirmed patient's wishes therefore Partial Code order placed indicating only vasopressors could be continued, no intubation, no CPR.

## 2015-11-16 NOTE — Progress Notes (Signed)
Inpatient Diabetes Program Recommendations  AACE/ADA: New Consensus Statement on Inpatient Glycemic Control (2015)  Target Ranges:  Prepandial:   less than 140 mg/dL      Peak postprandial:   less than 180 mg/dL (1-2 hours)      Critically ill patients:  140 - 180 mg/dL  Results for Linda Jones, Linda Jones (MRN [id number]) as of 10/29/2015 10:39  Ref. Range 10/30/2015 07:27 10/30/2015 11:29 10/30/2015 15:50 10/30/2015 19:51 10/30/2015 22:06 10/24/2015 07:37  Glucose-Capillary Latest Ref Range: 65-99 mg/dL 829 (H) 562 (H) 130 (H) 188 (H) 166 (H) 169 (H)   Review of Glycemic Control  Diabetes history: DM2 Outpatient Diabetes medications: Glipizide 5 mg QAM, Januvia 100 mg daily Current orders for Inpatient glycemic control: Novolog 0-9 units TID with meals, Novolog 0-5 units QHS  Inpatient Diabetes Program Recommendations: Insulin - Basal: Please consider ordering low dose basal insulin; recommend starting with Levemir 5 units Q24H starting now.  Thanks, Orlando Penner, RN, MSN, CDE Diabetes Coordinator Inpatient Diabetes Program [phone] (Team Pager from 8am to 5pm) [phone] ([facility] office) [phone] [facility] office) [phone] [facility] office)

## 2015-11-16 NOTE — Progress Notes (Signed)
RN spoke with Dr. Dema Severin about patient's heart rate and rythm.  Rhythm appears to be afib and rate increasing in mid to upper 130's.  Also RN discussed with MD that patient's blood pressure continues to remain low but fluctuating from 60's to 90's systolically and that RN obtained blood pressure in both lower legs and left arm(unable to take pressure in right arm due to PICC line).  Patient continues to arouse to voice and is A&Ox4 with forgetfulness.  Dr. Dema Severin stated that he would order an EKG.

## 2015-11-16 NOTE — Progress Notes (Addendum)
RN showed Dr. Dema Severin EKG which shows Sinus tach with PACs.  MD and RN discussed patient's low blood pressure and levophed drip running at 73mcg/min as well as need for A-line. Blood pressure continues to fluctuate 60-90's systolically.  Patient is A&Ox4.  Dr. Dema Severin stated he would assess for an arterial line early this afternoon but for RN to titrate down the levophed because he does not believe that the blood pressure readings are accurate.

## 2015-11-16 NOTE — Progress Notes (Signed)
97.4 axillary, placed warm blankets on pt and warmed room. Will reassess.

## 2015-11-16 NOTE — Progress Notes (Signed)
Linda Jones   Subjective  - 3 Days Post-Op  Breathing OK.  Still not making much urine On pressors and low BP persists Getting CRRT  Objective Filed Vitals:   November 06, 2015 1020 Nov 06, 2015 1025 November 06, 2015 1030 2015/11/06 1031  BP:   Pulse: 131 132 132 134  Temp: 97.7 F (36.5 C) 97.7 F (36.5 C) 97.7 F (36.5 C) 97.7 F (36.5 C)  TempSrc:      Resp: Height:      Weight:      SpO2: 98% 100% 100% 100%    Intake/Output Summary (Last 24 hours) at November 06, 2015 1032 Last data filed at 11-06-15 1000  Gross per 24 hour  Intake 2221.04 ml  Output    509 ml  Net 1712.04 ml    PULM  CTAB CV  RRR VASC  Right thigh bruising stable Catheter in left groin, C/D/I  Laboratory CBC    Component Value Date/Time   WBC 11.1* 10/29/2015 0926   WBC 8.7 10/10/2015 1630   WBC 7.1 05/13/2012 1105   HGB 9.8* 10/29/2015 1839   HGB 11.2* 05/13/2012 1105   HCT 30.0* 10/29/2015 1839   HCT 34.1 10/10/2015 1630   HCT 33.8* 05/13/2012 1105   PLT 118* 10/29/2015 0926   PLT 394* 10/10/2015 1630   PLT 297 05/13/2012 1105    BMET    Component Value Date/Time   NA 137 2015-11-06 0825   NA 143 10/10/2015 1630   NA 140 05/13/2012 1105   K 4.6 11/06/2015 0825   K 4.6 05/13/2012 1105   CL 106 November 06, 2015 0825   CL 104 05/13/2012 1105   CO2 26 06-Nov-2015 0825   CO2 33* 05/13/2012 1105   GLUCOSE 179* 11/06/15 0825   GLUCOSE 139* 10/10/2015 1630   GLUCOSE 117* 05/13/2012 1105   BUN 15 11-06-2015 0825   BUN 36* 10/10/2015 1630   BUN 30* 05/13/2012 1105   CREATININE 1.23* 2015/11/06 0825   CREATININE 1.4* 11/24/2014   CREATININE 1.05 05/13/2012 1105   CALCIUM 8.2* 11/06/2015 0825   CALCIUM 9.9 05/13/2012 1105   GFRNONAA 40* Nov 06, 2015 0825   GFRNONAA 51* 05/13/2012 1105   GFRAA 46* 11-06-15 0825   GFRAA 59* 05/13/2012 1105    Assessment/Planning: POD #3 s/p covered stent for bleeding pseudoaneurysm   No obvious  signs of ongoing bleeding  ARF, catheter with higher pressures but working for CRRT  If renal function does not improve, will likely need Permcath after she stabilizes  BP still low on pressors.      Linda Jones  11/06/2015, 10:32 AM

## 2015-11-16 NOTE — Progress Notes (Addendum)
Dr. Juliann Pares called back and RN made MD aware that patient passed away and that time of death was 1919. Nursing supervisor was also made aware of patient's death by Hiral, RN.  Hiral, RN to call [facility].

## 2015-11-16 NOTE — Progress Notes (Signed)
RN pressed ELINK button in room and spoke with Bufford Lope, RN and made her aware that patient is having slurred, garbled speech. History of TIA. Patient able to follow commands and is alert to place, self and month. RN to have Dr. Vassie Loll call back.

## 2015-11-16 NOTE — Progress Notes (Signed)
Dr. Dema Severin informed RN that Dr. Nicholos Johns was coming in to place arterial line.

## 2015-11-16 NOTE — Progress Notes (Signed)
RN spoke with Dr. Dema Severin about placing arterial line and MD stated that he was going to have to do another procedure first on a different patient.

## 2015-11-16 NOTE — Progress Notes (Signed)
RN pressed ELINK button in room and spoke with Bufford Lope, RN regarding blood pressure. 71/43 art line pressure and cuff pressure 53/41 and patient on 90mcg/min of levophed which is max.  RN asked Bufford Lope, RN to ask MD about starting vasopressin.

## 2015-11-16 NOTE — Progress Notes (Signed)
RN paged Dr. Juliann Pares to make MD aware that patient's heart rhythm is afib in the mid to upper 130's.

## 2015-11-16 NOTE — Progress Notes (Signed)
[facility] called for referral.  CDS released pt to [facility].  Family at bedside at this time.  Chaplain at bedside with patient's son at this time.  Awaiting more family to arrive.  Nursing supervisor informed.

## 2015-11-16 NOTE — Progress Notes (Signed)
Dr. Nicholos Johns and NP in room attempting Arterial line insertion.

## 2015-11-16 NOTE — Progress Notes (Signed)
Subjective:  Patient states she feels sleepy and tired denies any pain slightly short of breath resting comfortably in bed.   Objective:  Vital Signs in the last 24 hours: Temp:  [96 F (35.6 C)-99 F (37.2 C)] 97.9 F (36.6 C) (02/13 1330) Pulse Rate:  [40-136] 130 (02/13 1330) Resp:  [16-28] 22 (02/13 1330) BP: (59-165)/(24-141) 69/38 mmHg (02/13 1330) SpO2:  [89 %-100 %] 98 % (02/13 1330) FiO2 (%):  [28 %] 28 % (02/13 0048) Weight:  [88 kg (194 lb 0.1 oz)] 88 kg (194 lb 0.1 oz) (02/13 0500)  Intake/Output from previous day: 02/12 0701 - 02/13 0700 In: 2924.6 [P.O.:420; I.V.:2454.6; IV Piggyback:50] Out: 523 [Urine:50] Intake/Output from this shift: Total I/O In: 288.9 [I.V.:288.9] Out: 210 [Other:39; Blood:171]  Physical Exam: General appearance: alert, cooperative and appears stated age Neck: no adenopathy, no carotid bruit, no JVD, supple, symmetrical, trachea midline and thyroid not enlarged, symmetric, no tenderness/mass/nodules Lungs: clear to auscultation bilaterally Heart: irregularly irregular rhythm Abdomen: soft, non-tender; bowel sounds normal; no masses,  no organomegaly Extremities: extremities normal, atraumatic, no cyanosis or edema Pulses: 2+ and symmetric Skin: Skin color, texture, turgor normal. No rashes or lesions Neurologic: Alert and oriented X 3, normal strength and tone. Normal symmetric reflexes. Normal coordination and gait Right groin area appears to be healed no swelling good pulses  Lab Results:  Recent Labs  10/29/15 0926 10/29/15 1839 11/14/2015 0825  WBC 11.1*  --  10.5  HGB 9.6* 9.8* 8.0*  PLT 118*  --  90*    Recent Labs  10/30/2015 0434 11/02/2015 0825  NA 135 137  K 3.6 4.6  CL 108 106  CO2 22 26  GLUCOSE 302* 179*  BUN 14 15  CREATININE 1.19* 1.23*   No results for input(s): TROPONINI in the last 72 hours.  Invalid input(s): CK, MB Hepatic Function Panel  Recent Labs  10/21/2015 0825  ALBUMIN 2.4*   No results for  input(s): CHOL in the last 72 hours. No results for input(s): PROTIME in the last 72 hours.  Imaging: Imaging results have been reviewed  Cardiac Studies:  Assessment/Plan:  Atrial Fibrillation Cardiomyopathy CHF Hypotension/Shock Palpitations Shortness of Breath Valvular Disease Vascular Disease  Acute on chronic renal insufficiency Anemia Diabetes type 2 uncomplicated . Agree with critical care therapy Continue pressors for blood pressure support We'll consider amiodarone for paroxysmal atrial fibrillation and tachycardia Appreciated nephrology for CRRT for renal insufficiency Critical aortic stenosis consider evaluation for TAVR Moderate to severe cardiomyopathy with systolic dysfunction diuretic therapy unable to institute ACE     inhibitor beta blocker Insulin therapy for hyperglycemia and diabetes Consider transfusion when necessary for anemia maintain at least hemoglobin of 8 Severe mitral and tricuspid regurgitation Moderate to severe pulmonary hypertension consider diuretics consider oxygen therapy Consider transfer the patient to tertiary care center [facility] to accept the patient in transfer but currently there is no beds in the unit so hopefully  t     transfer can occur within 24-48 hours Case discussed with patient and family    LOS: 4 days    CALLWOOD,DWAYNE D. 11/10/2015, 1:41 PM

## 2015-11-16 NOTE — Progress Notes (Signed)
Dr. Vassie Loll from [facility] cameraed into room and assessed patient. Patient followed Dr. Reginia Naas commands and followed nurses commands. Patient remains alert to self and place but continues to have slurred, garbled speech.  Pupils are 3mm right and brisk and 2mm left and brisk and MD is aware. Equal hand grips and plantar flexion. RN asked MD about starting vasopressin since levophed is maxxed out at 72mcg/min and blood pressure continues to remain with MAP less than 65. Patient's son, Amada Jupiter at bedside and also communicating with Dr. Vassie Loll.  Dr. Vassie Loll discussed with Amada Jupiter and RN that patient is too unstable at this time to go down for head CT but for RN to continue to monitor patient's Neuro symptoms and to perform [facility] neuro checks. MD also gave order to start vasopressin drip.

## 2015-11-16 NOTE — Progress Notes (Signed)
PULMONARY / CRITICAL CARE MEDICINE   Name: Linda Jones MRN: [id number] DOB: [birth year]    ADMISSION DATE:  10/24/2015  CHIEF COMPLAINT: Acute respiratory distress  HISTORY OF PRESENT ILLNESS:   82 y.o. female with a PMHx of diabetes mellitus type 2, hypertension, hyperlipidemia, atrial fibrillation, carotid stenosis, CVA, B12 deficiency who was admitted to [facility] on 10/23/2015 status post cardiac catheterization after she developed a right thigh hematoma.  -This was ultimately found to be a right sided femoral pseudoaneurysm. S/p stent placement by vasc surgery -now with acute renal failure as she does have underlying chronic kidney disease stage III.  -her abd c/w acute resp and metabolic acidosis -she is also has very low BP started on 3 vasopressors.  -Patient received 3 units PRBc's  SUBJECTIVE:  Low blood pressures with MAPs in the 50s that are not correlating with patient's clinical condition. She is fully awake; denies chest pain, sob, dizziness and headache.   VITAL SIGNS: BP 69/31 mmHg  Pulse 127  Temp(Src) 96.4 F (35.8 C) (Rectal)  Resp 19  Ht  (1.6 m)  Wt 194 lb 0.1 oz (88 kg)  BMI 34.38 kg/m2  SpO2 100%  HEMODYNAMICS:    VENTILATOR SETTINGS: Vent Mode:  [-]  FiO2 (%):  [28 %] 28 %  INTAKE / OUTPUT: I/O last 3 completed shifts: In: 6040.7 [P.O.:420; I.V.:5570.7; IV Piggyback:50] Out: 548 [Urine:75; Other:473]  PHYSICAL EXAMINATION: General: Pale, chronically ill looking, NAD HEENT:  Conjunctivae pale, PERRLA, oral mucosa pink Neuro: AAO X 3, moves all extremities, no focal deficits Cardiovascular: Afin at 124 bpm, no MRG, +2 edema in BLUE, +1 in BLLE Lungs: CTAB, no wheezing Abdomen:  Soft, NT/ND, normal bowel sounds Musculoskeletal:  No joint deformities, +ROM Skin: extensive bruising in BLUE  LABS:  BMET  Recent Labs Lab 11/07/2015 0059 11/03/2015 0434 11/13/2015 0825  NA 134* 135 137  K 3.7 3.6 4.6  CL 107 108 106  CO2 21* 22 26  BUN  CREATININE 1.36* 1.19* 1.23*  GLUCOSE 305* 302* 179*    Electrolytes  Recent Labs Lab 10/30/15 0809  11/08/2015 0059 11/01/2015 0434 10/21/2015 0825  CALCIUM 7.7*  < > 6.9* 6.8* 8.2*  MG 1.5*  --   --  1.7  --   PHOS 3.8  < > 2.3* 2.1* 2.5  < > = values in this interval not displayed.  CBC  Recent Labs Lab Nov 02, 2015 0402  10/29/15 0926 10/29/15 1839 10/26/2015 0825  WBC 10.0  --  11.1*  --  10.5  HGB 8.4*  < > 9.6* 9.8* 8.0*  HCT 26.1*  --  29.9* 30.0* 24.6*  PLT 196  --  118*  --  90*  < > = values in this interval not displayed.  Coag's  Recent Labs Lab 11/12/2015 1941  APTT <24*  INR 1.18    Sepsis Markers No results for input(s): LATICACIDVEN, PROCALCITON, O2SATVEN in the last 168 hours.  ABG  Recent Labs Lab 10/29/15 0850  PHART 7.21*  PCO2ART 65*  PO2ART 68*    Liver Enzymes  Recent Labs Lab 10/23/2015 1941  10/29/2015 0059 11/12/2015 0434 10/26/2015 0825  AST 13*  --   --   --   --   ALT 7*  --   --   --   --   ALKPHOS 42  --   --   --   --   BILITOT 0.7  --   --   --   --  ALBUMIN 2.9*  < > 2.0* 2.0* 2.4*  < > = values in this interval not displayed.  Cardiac Enzymes No results for input(s): TROPONINI, PROBNP in the last 168 hours.  Glucose  Recent Labs Lab 10/30/15 1550 10/30/15 1951 10/30/15 2206 11/05/2015 0737 11/06/2015 1235 11/14/2015 1551  GLUCAP 228* 188* 166* 169* 176* 158*    Imaging No results found.   STUDIES:  02/10: [place] right lower extremity: Pseudoaneurysm from the common femoral artery with large overlying hematoma 02/09: CT abdomen and Pelvis: Large amount of intramuscular hemorrhage within the anterior compartment of the upper right thigh and short adductors  CULTURES: MRSA screen negative  ANTIBIOTICS: None  SIGNIFICANT EVENTS: 02/09: OR for cardia catheterization>Developed right thigh hematoma with bleeding and hypotension>admitted to the ICU 02/10: OR for active bleeding from a pseudoaneurysm in the  superficial femoral artery>back to ICU 02/13>Persistent hypotension>A-Line placed  LINES/TUBES: Left radial arterial line>02/13- HD catheter 02/11> Right PICC line 02/10>  DISCUSSION: 83 yo female presenting with acute on chronic renal failure necessitating CRRT,  Acute blood loss anemia, right femoral artery bleeding pseudoaneurysm, afib and hypotension post cardiac catheterization  ASSESSMENT / PLAN:  PULMONARY A: Acute respiratory failure  Metabolic acidosis P:   -Supplemental O2 to keep SPO>92% -Bronchodilator -CXR prn  CARDIOVASCULAR A:  Hypovolemic shock Afib Acute systolic heart failure Severe mitral and tricuspid regurgitation Severe AS Pulmonary hypertension P:  -Cardiology following -Pressors to keep MAP>65 -Monitor I/O -Diuresis per cardiology -Per cardiology, patient needs to be transferred to [facility]; awaiting bed availability  -Needs A-Line for more accurate BP monitoring. Vascular surgery consulted for A-line placement.   RENAL A:   Acute on chronic renal failure 2/2 IV dye and volume depletion-Anuric but creatinine improving P:   -Nephrology following -CRRT per nephrology -labs per nephrology -Monitor and replace electrolytes  HEMATOLOGIC A:   Acute on chronic anemia-Hg trending down; 8.0 (9.8 02/11)  Thrombocytopenia P:  -Transfuse per ICU protocol -Lovenox for DVT prophylaxis -CBC daily  ENDOCRINE A:   Type 2 Diabetes mellitus  P:   -Blood glucose monitoring with SSI per ICU protocol    FAMILY  - Updates: Son at bedside; updated on need for A-line  - Inter-disciplinary family meet or Palliative Care meeting due by: 02/19    11/08/2015, 5:22 PM

## 2015-11-16 NOTE — Progress Notes (Signed)
eLink Physician-Brief Progress Note Patient Name: Linda Jones DOB: 08-11-1932 MRN: [id number]   Date of Service  10/29/2015  HPI/Events of Note  Agonal rhythm No pulse- PEA  eICU Interventions  Informed son - death imminent RN to declare     Intervention Category Major Interventions: End of life / care limitation discussion  ALVA,RAKESH V. 11/03/2015, 7:23 PM

## 2015-11-16 NOTE — Progress Notes (Signed)
RN spoke with Dr. Juliann Pares and made MD aware that patient is having slurred, garbled speech. History of TIA. Patient able to follow commands and is alert to place, self and month. RN made Dr. Juliann Pares aware that Dr. Vassie Loll from [facility] is aware and does not feel patient is stable enough for a head CT at this time. Dr. Juliann Pares agreed and gave order for lovenox at this time.

## 2015-11-16 NOTE — Progress Notes (Signed)
CRRT stopped at this time because filter clotted. Dialysis access packed with heparin. RN will restart CRRT with new cartridge.

## 2015-11-16 NOTE — Progress Notes (Signed)
This RN and Hiral, RN at bedside and had just started report for change of shift and were repositioning patient per her request. Patient stared off and then had agonal respirations with agonal rhythm change on the monitor and bradied down.  Patient Partial code therefore no interventions.  This RN called patient's son at bedside and made Amada Jupiter aware that patient was actively passing away.  This RN and Artist each held patient's hands.  ELINK button pressed  Dr. Hyman Bible camered in and RN made MD aware that patient stared off and then rhythm change with agonal respirations. Dr. Hyman Bible to place order for nurse may pronounce. Asystole per cardiac monitor at 1919.  RN attempted to doppler a femoral pulse and unable to do so.  This RN and Hiral, RN auscultated for heart tones and none were heard. Patient's time of death 9.  Dr. Vassie Loll calling patient's son, Amada Jupiter to speak with him. RN paged Dr. Juliann Pares to make him of aware of patient's death.

## 2015-11-16 NOTE — Progress Notes (Signed)
RN spoke with Dr. Juliann Pares and made MD aware of patient's heart rate and rhythm being Afib rate in mid to upper 130's and that patient's blood pressure is low and patient on levophed drip but that RN does not believe that patient's blood pressure reading's are accurate because patient is mentating and caring on conversations with nurse and son. RN made MD aware that Dr. Dema Severin ordered an EKG.  Dr. Juliann Pares acknowledged and gave no new orders.

## 2015-11-16 NOTE — Progress Notes (Signed)
Pt. AOX4. Slept about 2 hrs last night. ST w/ST depression. HR 1 teen to 1 twenties. Lungs clear on 2L [place]. CRRT through duration of the night with no complications. Placed on bair hugger for rectal temp of 96 at 0500.

## 2015-11-16 NOTE — Consult Note (Signed)
PHARMACY  Pharmacy Consult for: Review Meds DAILY for adjustment due to CRRT    Allergies  Allergen Reactions  . Erythromycin     Patient Measurements: Height:  (160 cm) Weight: 194 lb 0.1 oz (88 kg) IBW/kg (Calculated) : 52.4  Vital Signs: Temp: 99 F (37.2 C) (02/13 1230) Temp Source: Rectal (02/13 0900) BP: 66/42 mmHg (02/13 1230) Pulse Rate: 134 (02/13 1230)   Recent Labs  10/29/15 0926  10/29/15 1839  10/30/15 0809  11/05/2015 0059 11/09/2015 0434 11/04/2015 0825  WBC 11.1*  --   --   --   --   --   --   --  10.5  HGB 9.6*  --  9.8*  --   --   --   --   --  8.0*  HCT 29.9*  --  30.0*  --   --   --   --   --  24.6*  PLT 118*  --   --   --   --   --   --   --  90*  CREATININE 3.67*  < >  --   < > 2.52*  < > 1.36* 1.19* 1.23*  MG  --   --   --   --  1.5*  --   --  1.7  --   PHOS  --   < >  --   < > 3.8  < > 2.3* 2.1* 2.5  ALBUMIN  --   < >  --   < > 2.5*  < > 2.0* 2.0* 2.4*  < > = values in this interval not displayed. Estimated Creatinine Clearance: 37.7 mL/min (by C-G formula based on Cr of 1.23).   Recent Labs  10/30/15 2206 11/07/2015 0737 11/07/2015 1235  GLUCAP 166* 169* 176*    Microbiology: Recent Results (from the past 720 hour(s))  MRSA PCR Screening     Status: None   Collection Time: November 03, 2015  8:22 PM  Result Value Ref Range Status   MRSA by PCR NEGATIVE NEGATIVE Final    Comment:        The GeneXpert MRSA Assay (FDA approved for NASAL specimens only), is one component of a comprehensive MRSA colonization surveillance program. It is not intended to diagnose MRSA infection nor to guide or monitor treatment for MRSA infections.     Medications:  Scheduled:  . budesonide (PULMICORT) nebulizer solution  0.5 mg Nebulization BID  . insulin aspart  0-5 Units Subcutaneous QHS  . insulin aspart  0-9 Units Subcutaneous TID WC  . ipratropium-albuterol  3 mL Nebulization Q4H  . sodium chloride flush  3 mL Intravenous Q12H   Infusions:  .  sodium chloride Stopped (11/05/2015 0851)  . norepinephrine (LEVOPHED) Adult infusion 47 mcg/min (10/21/2015 1122)  . pureflow 2,500 mL/hr at 10/30/15 1405  . vasopressin (PITRESSIN) infusion - *FOR SHOCK* 0.03 Units/min (10/30/15 1419)   PRN: acetaminophen, heparin, ondansetron (ZOFRAN) IV, sodium chloride flush, traZODone  Assessment: 82 yo patient receiving CRRT. Pharmacy consulted for review Meds DAILY for adjustment due to CRRT  Plan:  At this time, all patient medications are dosed appropriately for CRRT.  Pharmacy will continue to monitor meds daily and adjust doses as needed.   Luisa Hart, PharmD Clinical Pharmacist   11/03/2015,12:52 PM

## 2015-11-16 NOTE — Progress Notes (Signed)
Dr. Dema Severin and Dr. Wynelle Link present at patient's bedside. RN discussed with both MD's patient's blood pressure being low and that RN is titrating levophed drip up.  Patient is A&Ox4.  Dr. Wynelle Link gave order to stop IVF and to stop UF.  Both physicians spoke with patient and patient's son Amada Jupiter.

## 2015-11-16 NOTE — Progress Notes (Signed)
CRRT restarted at this time. Heparin removed from both lumens of dialysis catheter prior to starting dialysis.

## 2015-11-16 NOTE — Progress Notes (Signed)
Pt's BP noted to be 73/54 on 40 mcs levophed. Occasionally as low as 55/41, but known vasculopath and currently no clinical sigs of hypotension (pt awake and alert).  Attempted right radial arterial line, but could not cannulate vessel. SBP increased to 170 during the procedure.  Pt has a known right pseudo-aneurysm of right femoral with hemodialysis catheter in left femoral.  Discussed with Dr. Wyn Quaker to evaluate re: possible arterial line access for BP monitoring.   -Wells Guiles, M.D.

## 2015-11-16 NOTE — Care Management (Signed)
CRRT. LTAC evaluation requested when patient more stable. She is currently on 2 liters O2. RNCM to continue to follow.

## 2015-11-16 NOTE — Op Note (Signed)
[facility]   OPERATIVE NOTE  DATE:  Nov 12, 2015  PRE-OPERATIVE DIAGNOSIS: Hypotension requiring pressors, acute renal failure, need for invasive blood pressure monitoring  POST-OPERATIVE DIAGNOSIS: Same as above  PROCEDURE: 1.   Ultrasound guidance for vascular access left radial artery 2.  Left radial arterial line placement   SURGEON: Schylar Wuebker  ASSISTANT(S): None  ANESTHESIA: None  ESTIMATED BLOOD LOSS: 15 cc  FINDING(S): 1.  Patent left radial artery  SPECIMEN(S):  None  INDICATIONS:   Linda Jones is a 82 y.o. female who presents with ARF and hypotension.  She needs an arterial line for invasive BP monitoring and lab draws.  DESCRIPTION: After obtaining full informed written consent, the patient's left wrist was prepped.  The [place] was used to visualize the left radial artery which was found to be patent and pulsatile. It was then accessed under direct ultrasound guidance without difficulty with a micropuncture needle. A micropuncture wire was then placed. Over the wire, a micropuncture catheter was placed into the radial artery and secured with a sterile dressing to be used as her radial arterial line. The patient tolerated the procedure well.  COMPLICATIONS: None  CONDITION: Stable  Linda Jones  2015-11-12, 5:06 PM

## 2015-11-16 NOTE — Progress Notes (Signed)
RN pressed ELINK button and made Audra, RN aware that patient is fidgity and anxious, also having some mild labored breathing.  RN unable to get patient comfortable in bed.  RN asked about possible blood gas?  Bufford Lope, RN acknowledged and will make Dr. Vassie Loll aware.

## 2015-11-16 NOTE — Progress Notes (Signed)
[facility]  ROUNDING NOTE   Subjective:   Son at bedside (works in [facility] [employer] [facility] ICU) CRRT  UF 473. UOP 50  NS at 75 Norepinephrine gtt  Off vasopressin  Objective:  Vital signs in last 24 hours:  Temp:  [96 F (35.6 C)-97.8 F (36.6 C)] 97 F (36.1 C) (02/13 0900) Pulse Rate:  [40-135] 127 (02/13 0900) Resp:  [16-26] 22 (02/13 0830) BP: (59-165)/(31-141) 82/42 mmHg (02/13 0900) SpO2:  [89 %-100 %] 99 % (02/13 0900) FiO2 (%):  [28 %] 28 % (02/13 0048) Weight:  [88 kg (194 lb 0.1 oz)] 88 kg (194 lb 0.1 oz) (02/13 0500)  Weight change: 5.445 kg (12 lb 0.1 oz) Filed Weights   10/29/15 0500 10/30/15 0500 11/12/2015 0500  Weight: 82.192 kg (181 lb 3.2 oz) 82.555 kg (182 lb) 88 kg (194 lb 0.1 oz)    Intake/Output: I/O last 3 completed shifts: In: 6040.7 [P.O.:420; I.V.:5570.7; IV Piggyback:50] Out: 548 [Urine:75; Other:473]   Intake/Output this shift:  Total I/O In: 75 [I.V.:75] Out: 24 [Other:24]  Physical Exam: General: Critically ill  Head: Normocephalic, atraumatic. Moist oral mucosal membranes  Eyes: Anicteric  Neck: Supple, trachea midline  Lungs:  Clear to auscultation normal effort  Heart: irregular  Abdomen:  Soft, nontender, BS present  Extremities: 1+ peripheral edema.  Neurologic: Nonfocal, moving all four extremities  Skin: No lesions  Access:  Left femoral temp HD catheter 10/29/15 Dr. Wyn Quaker    Basic Metabolic Panel:  Recent Labs Lab 10/30/15 0809  10/30/15 1628 10/30/15 1944 11/08/2015 0059 11/12/2015 0434 11/10/2015 0825  NA 134*  < > 134* 136 134* 135 137  K 4.8  < > 4.5 3.7 3.7 3.6 4.6  CL 105  < > 104 110 107 108 106  CO2 19*  < > 23 17* 21* 22 26  GLUCOSE 223*  < > 245* 238* 305* 302* 179*  BUN 34*  < > 26* CREATININE 2.52*  < > 2.07* 1.71* 1.36* 1.19* 1.23*  CALCIUM 7.7*  < > 7.8* 6.7* 6.9* 6.8* 8.2*  MG 1.5*  --   --   --   --  1.7  --   PHOS 3.8  < > 3.2 2.6 2.3* 2.1* 2.5  < > = values in this interval not  displayed.  Liver Function Tests:  Recent Labs Lab 11/05/2015 1941  10/30/15 1628 10/30/15 1944 11/10/2015 0059 11/13/2015 0434 11/06/2015 0825  AST 13*  --   --   --   --   --   --   ALT 7*  --   --   --   --   --   --   ALKPHOS 42  --   --   --   --   --   --   BILITOT 0.7  --   --   --   --   --   --   PROT 5.4*  --   --   --   --   --   --   ALBUMIN 2.9*  < > 2.3* 1.9* 2.0* 2.0* 2.4*  < > = values in this interval not displayed. No results for input(s): LIPASE, AMYLASE in the last 168 hours. No results for input(s): AMMONIA in the last 168 hours.  CBC:  Recent Labs Lab 11/07/2015 1643  2015/11/17 0021 11-17-2015 0402 11-17-2015 1253 10/29/15 0926 10/29/15 1839  WBC 6.6  --   --  10.0  --  11.1*  --   HGB 9.9*  < > 8.6* 8.4* 9.9* 9.6* 9.8*  HCT 30.1*  --   --  26.1*  --  29.9* 30.0*  MCV 95.0  --   --  97.0  --  93.0  --   PLT 180  --   --  196  --  118*  --   < > = values in this interval not displayed.  Cardiac Enzymes: No results for input(s): CKTOTAL, CKMB, CKMBINDEX, TROPONINI in the last 168 hours.  BNP: Invalid input(s): POCBNP  CBG:  Recent Labs Lab 10/30/15 1129 10/30/15 1550 10/30/15 1951 10/30/15 2206 2015-11-04 0737  GLUCAP 212* 228* 188* 166* 169*    Microbiology: Results for orders placed or performed during the hospital encounter of 10/25/2015  MRSA PCR Screening     Status: None   Collection Time: 11/11/2015  8:22 PM  Result Value Ref Range Status   MRSA by PCR NEGATIVE NEGATIVE Final    Comment:        The GeneXpert MRSA Assay (FDA approved for NASAL specimens only), is one component of a comprehensive MRSA colonization surveillance program. It is not intended to diagnose MRSA infection nor to guide or monitor treatment for MRSA infections.     Coagulation Studies: No results for input(s): LABPROT, INR in the last 72 hours.  Urinalysis: No results for input(s): COLORURINE, LABSPEC, PHURINE, GLUCOSEU, HGBUR, BILIRUBINUR, KETONESUR,  PROTEINUR, UROBILINOGEN, NITRITE, LEUKOCYTESUR in the last 72 hours.  Invalid input(s): APPERANCEUR    Imaging: No results found.   Medications:   . sodium chloride Stopped (04-Nov-2015 0851)  . norepinephrine (LEVOPHED) Adult infusion 36 mcg/min (2015/11/04 0917)  . pureflow 2,500 mL/hr at 10/30/15 1405  . vasopressin (PITRESSIN) infusion - *FOR SHOCK* 0.03 Units/min (10/30/15 1419)   . budesonide (PULMICORT) nebulizer solution  0.5 mg Nebulization BID  . insulin aspart  0-5 Units Subcutaneous QHS  . insulin aspart  0-9 Units Subcutaneous TID WC  . ipratropium-albuterol  3 mL Nebulization Q4H  . sodium chloride flush  3 mL Intravenous Q12H   acetaminophen, heparin, ondansetron (ZOFRAN) IV, sodium chloride flush, traZODone  Assessment/ Plan:  82 y.o.white female with diabetes mellitus type 2, hypertension, hyperlipidemia, atrial fibrillation, carotid stenosis, CVA, B12 deficiency who was admitted to [facility] on 10/22/2015 status post cardiac catheterization after she developed a right thigh hematoma and pseudoaneurysm s/p repair.  1. Acute renal failure on chronic kidney disease stage III baseline Cr 1.3.-1.5.  Acute renal failure secondary to contrast-induced nephropathy and hypotension. Most likely exacerbated by vasopressos.  Chronic kidney disease secondary to age, hypertension, and diabetes mellitus. - anuric. Requiring vasopressors. Will continue norepinephrine - Continue CRRT. Turn off UF, discontinue NS IVF.  - Change labs to q6.   2. Hypotension: secondary to hemodynamic instability from cardiogenic shock. On vasopressors. No signs of sepsis.   3. Anemia of chronic kidney disease.  Hemoglobin 9.8  - no indication for epo   LOS: 4 Tevis Dunavan 02/17/20179:48 AM

## 2015-11-16 NOTE — Progress Notes (Signed)
Dr. Nicholos Johns in room assessing patient for arterial line placement.

## 2015-11-16 NOTE — Progress Notes (Signed)
RN spoke with Dr. Vassie Loll and made MD aware that patient's breathing is mildly labored compared to previous in shift and that patient states she needs to catch her breath.  Patient also anxious and fidgety complaining of being hot and throwing sheet off, although her temperature is 35.5 degrees celcius.  RN asked about blood gas?  Dr. Vassie Loll stated that he didn't want a blood gas at this time and he was not comfortable at this time giving ativan or morphine. MD asked RN to have oncoming nurse to speak with patient's son about how he felt about patient receiving morphine.

## 2015-11-16 DEATH — deceased

## 2015-11-28 ENCOUNTER — Other Ambulatory Visit: Payer: Self-pay | Admitting: Family Medicine

## 2015-12-17 NOTE — Discharge Summary (Signed)
Physician Discharge Summary  Patient ID: Linda Jones MRN: [id number] DOB/AGE: April 10, 1932 82 y.o.  Admit date: 10/19/2015 Discharge date:11/04/2015  Admission Diagnoses: Aortic stenosis, congestive heart failure ,hypotension, atrial fibrillation diabetes  Discharge Diagnoses: Severe aortic stenosis, acute on chronic renal insufficiency, cardiogenic shock, congestive heart failure systolic dysfunction, hypotension , atrial fibrillation diabetes, hyperlipidemia  Active Problems:   Hypotension   Discharged Condition: Deceased  Hospital Course: Patient initially presented for cardiac catheterization for severe aortic stenosis heart failure symptoms renal insufficiency hypertension, cardiac catheter showed severe critical aortic stenosis with severely depressed left ventricular function minimal coronary artery disease mitral and tricuspid regurgitation with moderate to severe pulmonary hypertension. Patient was admitted after outpatient cardiac catheter placed in intensive care unit because of progressive cardiogenic shock heart failure critical aortic stenosis. Patient developed acute on chronic renal insufficiency because of progressive hypotension she was placed on pressors minimal fluid hydration nephrology consult was obtained to help with management of renal insufficiency. Pulmonary consult and critical care consult was obtained as well. Even with aggressive therapy the patient continued to deteriorate with multisystem organ failure with cardiogenic shock unresponsive to pressors and fluids. Family and patient wanted limited aggressive intervention so therefore refused balloon pump but accepted temporary CVR T dialysis therapy for acute renal failure. Discussions were made for potential transferred to [facility] for heart failure therapy and tertiary care intervention. The family reluctantly accepted transfer but at the time had no beds available. In the interim the patient continued to  deteriorate including altered mental status and subsequently expired at 1919. Family was made aware that was not unexpected no post was ordered or recommended.  Consults: cardiology, pulmonary/intensive care, nephrology and vascular surgery  Significant Diagnostic Studies: radiology: CXR: cardiomegaly, CHF and pulmonary edema and X-Ray: Pulmonary congestion, cardiac graphics: Telemetry: For tachycardia and ECG: Aortic stenosis heart failure and angiography: Coronary disease aortic stenosis foraminal disease cardiac catheterization  Treatments: IV hydration, cardiac meds: carvedilol and furosemide, anticoagulation: heparin, insulin: regular, respiratory therapy: O2, HHFM and albuterol/atropine nebulizer, procedures: PICC line, arterial line and internal jugular line and dialysis: CCPD  Discharge Exam: Blood pressure 101/59, pulse 123, temperature 95.9 F (35.5 C), temperature source Rectal, resp. rate 25, height  (1.6 m), weight 88 kg (194 lb 0.1 oz), SpO2 96 %. General appearance: alert and cooperative Resp: diminished breath sounds bibasilar and bilaterally, rales bibasilar and bilaterally and rhonchi bibasilar and bilaterally Cardio: irregularly irregular rhythm, S3 present and systolic murmur: systolic ejection 3/6, crescendo at 2nd right intercostal space GI: soft, non-tender; bowel sounds normal; no masses,  no organomegaly Extremities: edema 2+ lower extremities Pulses: 2+ and symmetric Neurologic: Alert and oriented X 3, normal strength and tone. Normal symmetric reflexes. Normal coordination and gait  Disposition: 20-Expired     Medication List    STOP taking these medications        hydrochlorothiazide 25 MG tablet  Commonly known as:  HYDRODIURIL     ONETOUCH DELICA LANCETS 33G Misc      TAKE these medications        aspirin 81 MG tablet  Take by mouth See admin instructions. Takes 3 times per week     calcium carbonate 600 MG Tabs tablet  Commonly known as:   OS-CAL  Take by mouth.     clopidogrel 75 MG tablet  Commonly known as:  PLAVIX  Take 1 tablet (75 mg total) by mouth daily.     cyanocobalamin 1000 MCG/ML injection  Commonly known as:  (  VITAMIN B-12)  INJECT 1 ML IN THE MUSCLE ONCE MONTHLY     ferrous sulfate 325 (65 FE) MG tablet  Take by mouth.     furosemide 20 MG tablet  Commonly known as:  LASIX  Take 1 tablet (20 mg total) by mouth daily.     glipiZIDE 5 MG tablet  Commonly known as:  GLUCOTROL  Take 1 tablet (5 mg total) by mouth daily.     glucose blood test strip  TRUETEST TEST (In Vitro Strip)  1 Strip two times daily and as needed for 0 days  Quantity: 100;  Refills: 12   Ordered :04-Jun-2012  Janey GreaserDeSanto, Elena ;  Started 04-Jun-2012 Active Comments: Medication taken as needed.     glucose blood test strip  Check sugar once daily. DX:E11.9     heparin 1000 unit/mL Soln injection  1-6 mLs (1,000-6,000 Units total) by CRRT route as needed (Use to fill CRRT catheter with heparin 1000 units/mL per catheter volume.).     lisinopril 20 MG tablet  Commonly known as:  PRINIVIL,ZESTRIL  Take 1 tablet (20 mg total) by mouth daily.     MULTIPLE VITAMIN PO  Take by mouth.     simvastatin 40 MG tablet  Commonly known as:  ZOCOR  Take 1 tablet (40 mg total) by mouth daily.     sitaGLIPtin 100 MG tablet  Commonly known as:  JANUVIA  Take 1 tablet (100 mg total) by mouth daily.     traMADol 50 MG tablet  Commonly known as:  ULTRAM  1 to 2 tablets at bedtime     traZODone 100 MG tablet  Commonly known as:  DESYREL  Take 1 tablet (100 mg total) by mouth at bedtime.         SignedDorothyann Peng: Kent Braunschweig D. 11/29/2015, 10:14 AM

## 2015-12-19 ENCOUNTER — Ambulatory Visit: Payer: Medicare Other | Admitting: Family Medicine

## 2016-03-10 IMAGING — CT CT ABD-PELV W/O CM
1 of 3 series · 12 of 32 positions shown, 17 images · non-contrast
Comparison: No priors.

CLINICAL DATA: 83-year-old female with right-sided leg swelling and
bruising following 8 cardiac catheterization today. Evaluate for
potential retroperitoneal hemorrhage.

EXAM:
CT ABDOMEN AND PELVIS WITHOUT CONTRAST
TECHNIQUE: Multidetector CT imaging of the abdomen and pelvis was performed
following the standard protocol without IV contrast.

[Series 2: routine abd pel without · axial · non-contrast · 0.87mm/px · z∈[-1203,-768]mm · 12 of 101 slices shown, 17 images]
[im 7/101  soft-tissue]
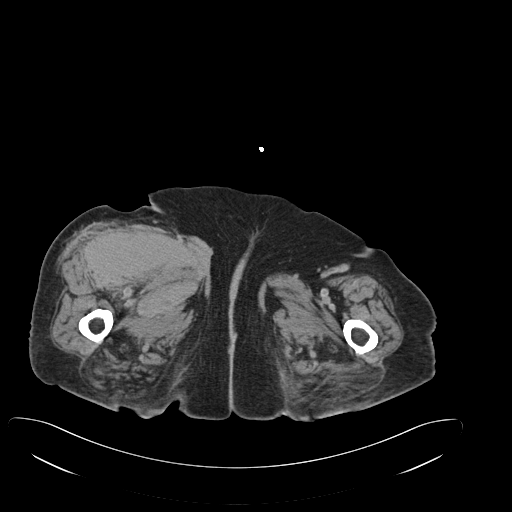
[im 7/101  bone]
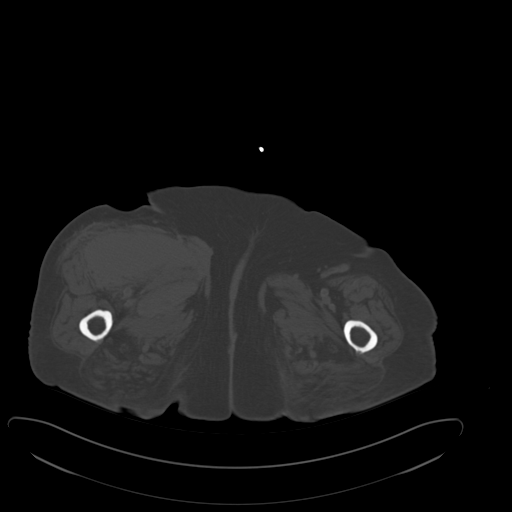
[im 14/101  soft-tissue]
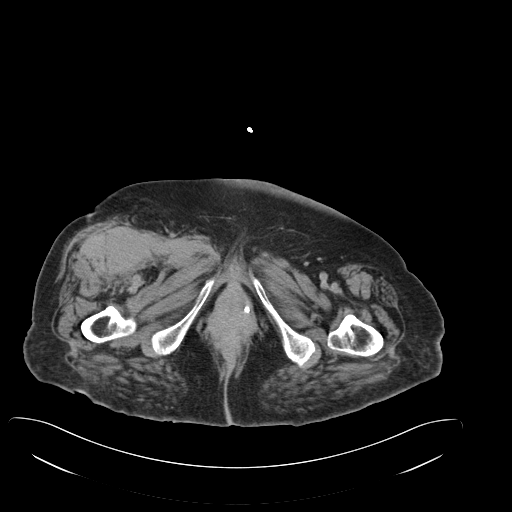
[im 27/101  soft-tissue]
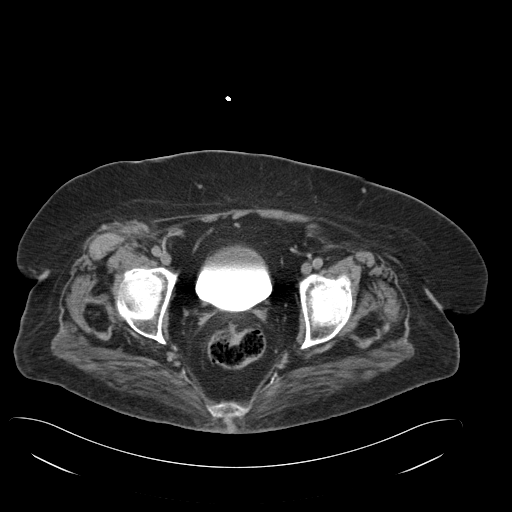
[im 34/101  soft-tissue]
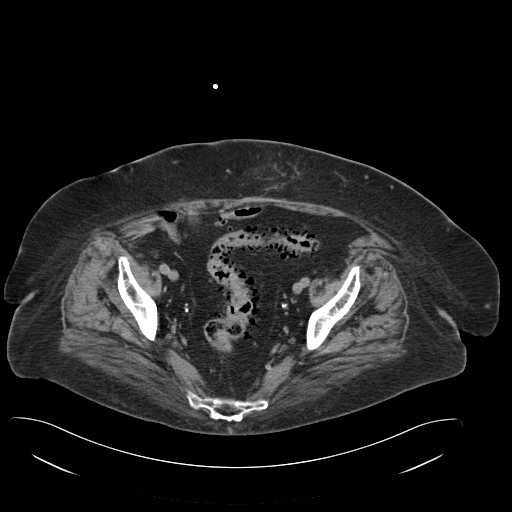
[im 41/101  soft-tissue]
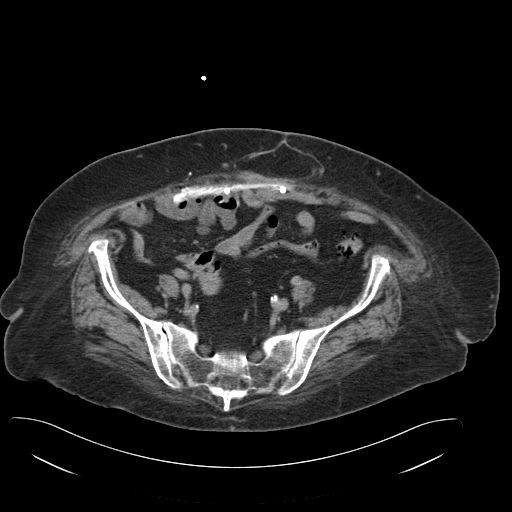
[im 54/101  soft-tissue]
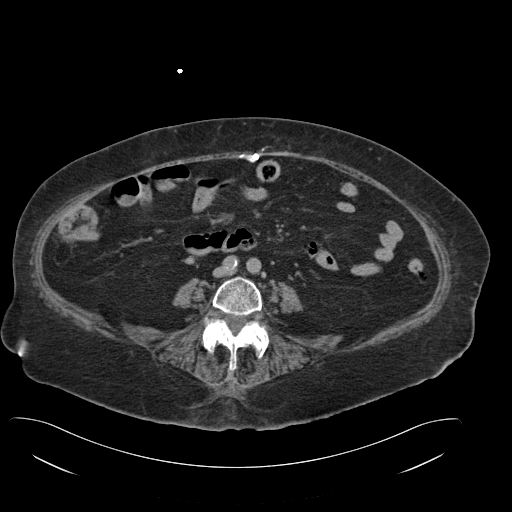
[im 61/101  soft-tissue]
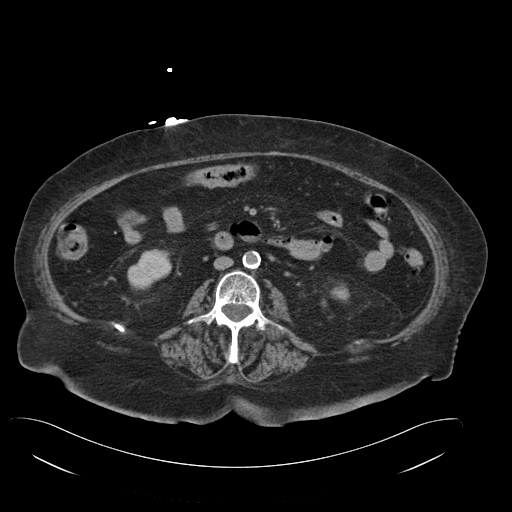
[im 67/101  soft-tissue]
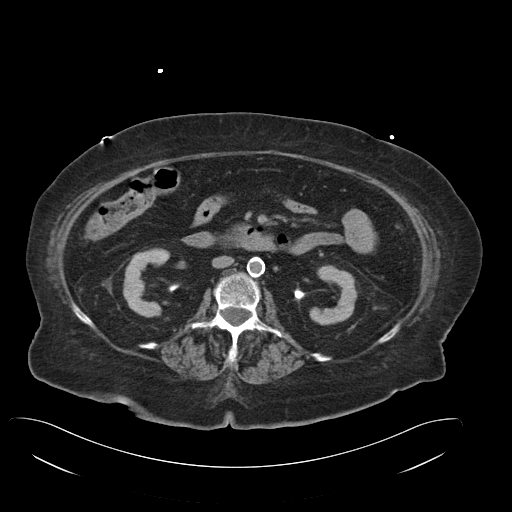
[im 74/101  soft-tissue]
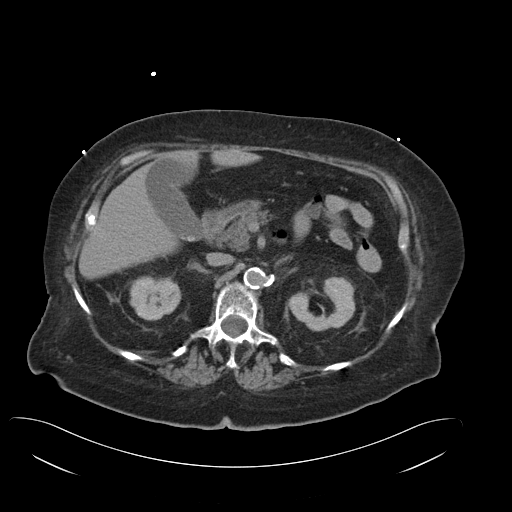
[im 74/101  lung]
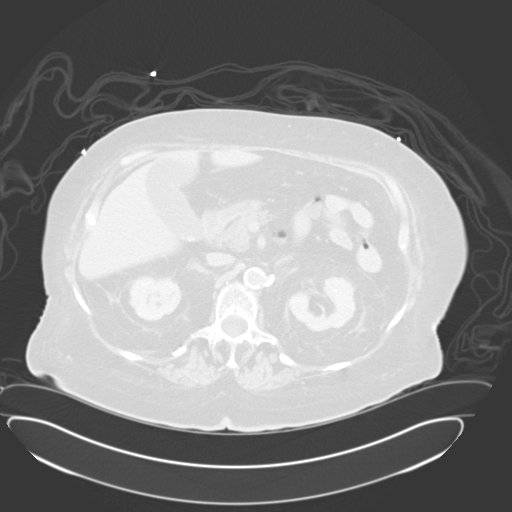
[im 74/101  bone]
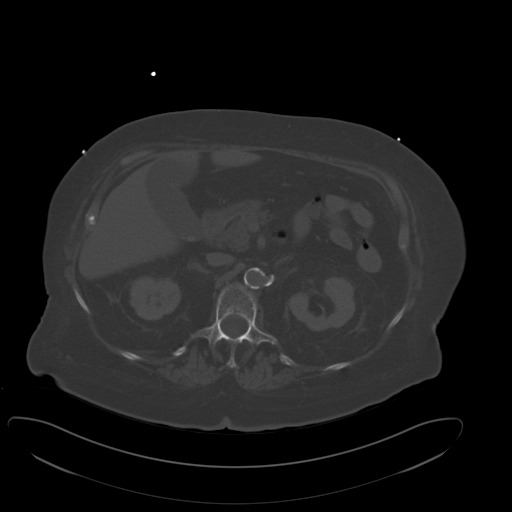
[im 81/101  lung]
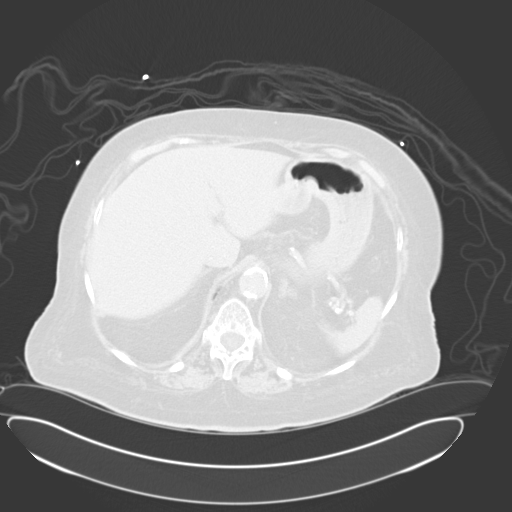
[im 87/101  soft-tissue]
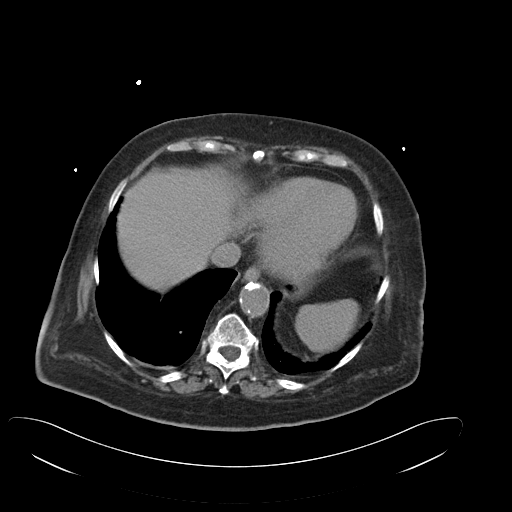
[im 87/101  lung]
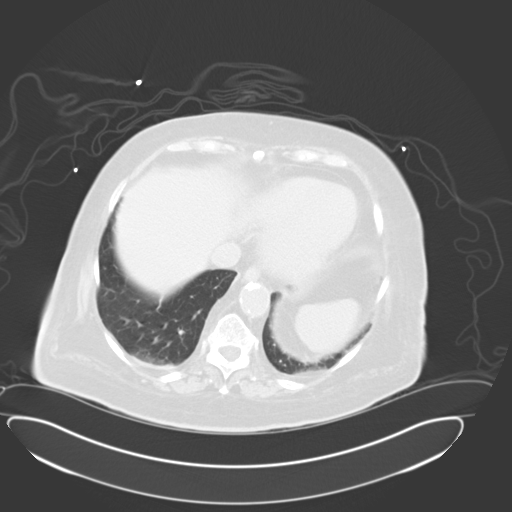
[im 94/101  soft-tissue]
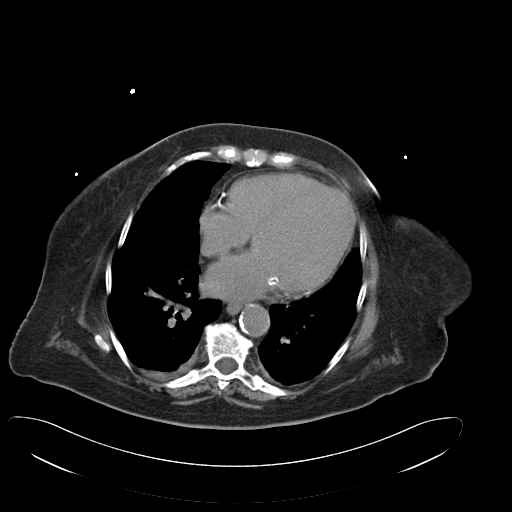
[im 94/101  lung]
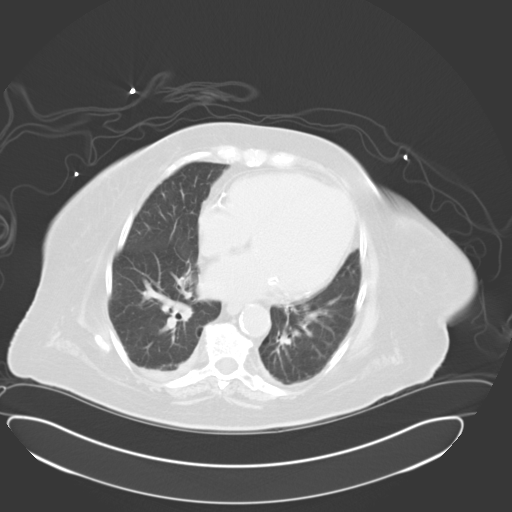

[12 of 32 positions shown; findings below may reference images not displayed]

FINDINGS: Lower chest: Trace bilateral pleural effusions. Heart size is mildly
enlarged with left ventricular dilatation. Severe calcifications of
the aortic valve and mitral valve/annulus. Atherosclerotic
calcifications are noted within the left circumflex and right
coronary arteries.

Hepatobiliary: No definite cystic or solid hepatic lesions are
identified on today's noncontrast CT examination. Tiny calcified
granuloma in the central aspect of the liver. Small calcified
gallstones are noted dependently in the gallbladder. No current
findings to suggest an acute cholecystitis at this time.

Pancreas: No pancreatic mass or peripancreatic inflammatory changes
on today's noncontrast CT examination.

Spleen: Unremarkable.

Adrenals/Urinary Tract: Kidneys are mildly atrophic bilaterally.
Mild nodular thickening of the adrenal glands bilaterally appears
relatively symmetric. No hydroureteronephrosis. Urinary bladder is
normal in appearance.

Stomach/Bowel: Unenhanced appearance of the stomach is normal. No
pathologic dilatation of small bowel or colon. Numerous colonic
diverticulae are noted, particularly throughout the descending colon
and sigmoid colon, without surrounding inflammatory changes to
suggest an acute diverticulitis at this time. Normal appendix.

Vascular/Lymphatic: Atherosclerotic calcifications throughout the
abdominal and pelvic vasculature, without definite aneurysm. No
lymphadenopathy noted in the abdomen or pelvis.

Reproductive: Status post hysterectomy. Ovaries are not confidently
identified may be surgically absent or atrophic.

Other: No high attenuation fluid collection in the retroperitoneum
or within the peritoneal cavity. However, there is marked expansion
and high attenuation throughout the anterior compartment of the
visualized upper thigh immediately superficial to the right femoral
arteries, presumably a large amount of intramuscular hemorrhage.
Some intramuscular hemorrhage is also noted in the short adductors.
This results in expansion of this musculature up to 9.8 x 7.9 cm
(image 98 of series 2), which is markedly asymmetric with the
contralateral side. Postoperative changes of mesh repair for ventral
hernia or umbilical hernia are noted. Notably, there is recurrent
herniation adjacent to the left side of the mesh, containing a short
segment of the mid transverse colon.

Musculoskeletal: There are no aggressive appearing lytic or blastic
lesions noted in the visualized portions of the skeleton.
IMPRESSION: 1. Large amount of intramuscular hemorrhage within the anterior
compartment of the upper right thigh and short adductors, as above.
2. No retroperitoneal hemorrhage identified at this time.
3. Status post mesh repair for ventral or umbilical hernia, with
evidence of recurrent herniation along the left lateral aspect of
the mesh, containing a short segment of mid transverse colon. No
evidence of associated bowel incarceration or obstruction at this
time.
4. Severe colonic diverticulosis without evidence of acute
diverticulitis at this time.
5. Trace bilateral pleural effusions.
6. Mild cardiomegaly with left ventricular dilatation.
7. Atherosclerosis, including at least 2 vessel coronary artery
disease.
8. There are calcifications of the aortic valve and mitral
valve/annulus. Echocardiographic correlation for evaluation of
potential valvular dysfunction may be warranted if clinically
indicated.
9. Additional incidental findings, as above.
These results will be called to the ordering clinician or
representative by the Radiologist Assistant, and communication
documented in the PACS or zVision Dashboard.
